# Patient Record
Sex: Male | Born: 1935
Health system: Southern US, Community
[De-identification: ages and names within clinical notes are randomized; demographics above are authoritative.]

## PROBLEM LIST (undated history)

## (undated) DIAGNOSIS — Z9889 Other specified postprocedural states: Secondary | ICD-10-CM

## (undated) DIAGNOSIS — M1990 Unspecified osteoarthritis, unspecified site: Secondary | ICD-10-CM

## (undated) DIAGNOSIS — G8929 Other chronic pain: Secondary | ICD-10-CM

## (undated) DIAGNOSIS — K219 Gastro-esophageal reflux disease without esophagitis: Secondary | ICD-10-CM

## (undated) DIAGNOSIS — E785 Hyperlipidemia, unspecified: Secondary | ICD-10-CM

## (undated) DIAGNOSIS — I491 Atrial premature depolarization: Secondary | ICD-10-CM

## (undated) DIAGNOSIS — N400 Enlarged prostate without lower urinary tract symptoms: Secondary | ICD-10-CM

## (undated) DIAGNOSIS — M549 Dorsalgia, unspecified: Secondary | ICD-10-CM

## (undated) DIAGNOSIS — K635 Polyp of colon: Secondary | ICD-10-CM

## (undated) HISTORY — DX: Polyp of colon: K63.5

## (undated) HISTORY — PX: OTHER SURGICAL HISTORY: SHX169

## (undated) HISTORY — DX: Gastro-esophageal reflux disease without esophagitis: K21.9

## (undated) HISTORY — DX: Atrial premature depolarization: I49.1

## (undated) HISTORY — DX: Dorsalgia, unspecified: M54.9

## (undated) HISTORY — DX: Unspecified osteoarthritis, unspecified site: M19.90

## (undated) HISTORY — DX: Benign prostatic hyperplasia without lower urinary tract symptoms: N40.0

## (undated) HISTORY — DX: Hyperlipidemia, unspecified: E78.5

## (undated) HISTORY — DX: Other specified postprocedural states: Z98.890

## (undated) HISTORY — DX: Other chronic pain: G89.29

---

## 1996-11-01 HISTORY — PX: OTHER SURGICAL HISTORY: SHX169

## 1999-09-29 ENCOUNTER — Encounter: Admission: RE | Admit: 1999-09-29 | Discharge: 1999-09-29 | Payer: Self-pay | Admitting: *Deleted

## 1999-10-01 ENCOUNTER — Ambulatory Visit (HOSPITAL_COMMUNITY): Admission: RE | Admit: 1999-10-01 | Discharge: 1999-10-01 | Payer: Self-pay | Admitting: *Deleted

## 1999-11-04 ENCOUNTER — Inpatient Hospital Stay (HOSPITAL_COMMUNITY): Admission: RE | Admit: 1999-11-04 | Discharge: 1999-11-05 | Payer: Self-pay | Admitting: Neurosurgery

## 1999-11-04 ENCOUNTER — Encounter: Payer: Self-pay | Admitting: Neurosurgery

## 1999-11-19 ENCOUNTER — Encounter: Payer: Self-pay | Admitting: Neurosurgery

## 1999-11-19 ENCOUNTER — Ambulatory Visit (HOSPITAL_COMMUNITY): Admission: RE | Admit: 1999-11-19 | Discharge: 1999-11-19 | Payer: Self-pay | Admitting: Neurosurgery

## 1999-12-10 ENCOUNTER — Encounter: Payer: Self-pay | Admitting: Neurosurgery

## 1999-12-10 ENCOUNTER — Encounter: Admission: RE | Admit: 1999-12-10 | Discharge: 1999-12-10 | Payer: Self-pay | Admitting: Neurosurgery

## 2000-12-13 ENCOUNTER — Ambulatory Visit (HOSPITAL_COMMUNITY): Admission: RE | Admit: 2000-12-13 | Discharge: 2000-12-13 | Payer: Self-pay | Admitting: Internal Medicine

## 2004-11-02 ENCOUNTER — Ambulatory Visit: Payer: Self-pay

## 2005-11-15 ENCOUNTER — Encounter: Admission: RE | Admit: 2005-11-15 | Discharge: 2005-11-15 | Payer: Self-pay | Admitting: Internal Medicine

## 2005-12-01 ENCOUNTER — Other Ambulatory Visit: Admission: RE | Admit: 2005-12-01 | Discharge: 2005-12-01 | Payer: Self-pay | Admitting: Interventional Radiology

## 2005-12-01 ENCOUNTER — Encounter: Admission: RE | Admit: 2005-12-01 | Discharge: 2005-12-01 | Payer: Self-pay | Admitting: Internal Medicine

## 2005-12-01 ENCOUNTER — Encounter (INDEPENDENT_AMBULATORY_CARE_PROVIDER_SITE_OTHER): Payer: Self-pay | Admitting: *Deleted

## 2008-10-11 ENCOUNTER — Emergency Department (HOSPITAL_COMMUNITY): Admission: EM | Admit: 2008-10-11 | Discharge: 2008-10-11 | Payer: Self-pay | Admitting: Family Medicine

## 2009-06-24 ENCOUNTER — Encounter: Admission: RE | Admit: 2009-06-24 | Discharge: 2009-06-24 | Payer: Self-pay | Admitting: Neurosurgery

## 2009-07-30 ENCOUNTER — Inpatient Hospital Stay (HOSPITAL_COMMUNITY): Admission: RE | Admit: 2009-07-30 | Discharge: 2009-08-01 | Payer: Self-pay | Admitting: Neurosurgery

## 2009-07-30 HISTORY — PX: OTHER SURGICAL HISTORY: SHX169

## 2009-08-07 ENCOUNTER — Inpatient Hospital Stay (HOSPITAL_COMMUNITY): Admission: EM | Admit: 2009-08-07 | Discharge: 2009-08-09 | Payer: Self-pay | Admitting: Neurosurgery

## 2009-08-18 ENCOUNTER — Inpatient Hospital Stay (HOSPITAL_COMMUNITY): Admission: AD | Admit: 2009-08-18 | Discharge: 2009-08-21 | Payer: Self-pay | Admitting: Neurosurgery

## 2009-08-19 ENCOUNTER — Ambulatory Visit: Payer: Self-pay | Admitting: Infectious Diseases

## 2009-08-28 ENCOUNTER — Encounter: Payer: Self-pay | Admitting: Infectious Diseases

## 2009-08-31 ENCOUNTER — Encounter: Payer: Self-pay | Admitting: Internal Medicine

## 2009-09-01 ENCOUNTER — Ambulatory Visit: Payer: Self-pay | Admitting: Infectious Diseases

## 2009-09-01 DIAGNOSIS — L299 Pruritus, unspecified: Secondary | ICD-10-CM | POA: Insufficient documentation

## 2009-09-01 DIAGNOSIS — M869 Osteomyelitis, unspecified: Secondary | ICD-10-CM | POA: Insufficient documentation

## 2009-09-01 DIAGNOSIS — G4700 Insomnia, unspecified: Secondary | ICD-10-CM | POA: Insufficient documentation

## 2009-09-02 ENCOUNTER — Encounter: Payer: Self-pay | Admitting: Infectious Diseases

## 2009-09-03 ENCOUNTER — Telehealth: Payer: Self-pay | Admitting: Infectious Diseases

## 2009-09-03 ENCOUNTER — Encounter: Payer: Self-pay | Admitting: Infectious Diseases

## 2009-09-07 ENCOUNTER — Telehealth: Payer: Self-pay | Admitting: Infectious Diseases

## 2009-09-07 ENCOUNTER — Encounter: Payer: Self-pay | Admitting: Infectious Diseases

## 2009-09-09 ENCOUNTER — Telehealth: Payer: Self-pay | Admitting: Internal Medicine

## 2009-09-09 DIAGNOSIS — R079 Chest pain, unspecified: Secondary | ICD-10-CM | POA: Insufficient documentation

## 2009-09-11 ENCOUNTER — Encounter: Payer: Self-pay | Admitting: Infectious Diseases

## 2009-09-12 ENCOUNTER — Encounter: Payer: Self-pay | Admitting: Infectious Diseases

## 2009-09-14 ENCOUNTER — Telehealth: Payer: Self-pay | Admitting: Infectious Diseases

## 2009-09-15 ENCOUNTER — Telehealth (INDEPENDENT_AMBULATORY_CARE_PROVIDER_SITE_OTHER): Payer: Self-pay | Admitting: *Deleted

## 2009-09-15 ENCOUNTER — Ambulatory Visit (HOSPITAL_COMMUNITY): Admission: RE | Admit: 2009-09-15 | Discharge: 2009-09-15 | Payer: Self-pay | Admitting: Infectious Diseases

## 2009-09-16 ENCOUNTER — Encounter (INDEPENDENT_AMBULATORY_CARE_PROVIDER_SITE_OTHER): Payer: Self-pay

## 2009-09-16 ENCOUNTER — Ambulatory Visit: Payer: Self-pay

## 2009-09-16 ENCOUNTER — Encounter (HOSPITAL_COMMUNITY): Admission: RE | Admit: 2009-09-16 | Discharge: 2009-11-04 | Payer: Self-pay | Admitting: Internal Medicine

## 2009-09-16 ENCOUNTER — Telehealth: Payer: Self-pay | Admitting: Infectious Diseases

## 2009-09-17 ENCOUNTER — Encounter: Payer: Self-pay | Admitting: Internal Medicine

## 2009-09-17 ENCOUNTER — Telehealth: Payer: Self-pay | Admitting: Internal Medicine

## 2009-09-18 ENCOUNTER — Encounter: Payer: Self-pay | Admitting: Cardiology

## 2009-09-18 ENCOUNTER — Ambulatory Visit: Payer: Self-pay | Admitting: Cardiology

## 2009-09-18 ENCOUNTER — Ambulatory Visit (HOSPITAL_COMMUNITY): Admission: RE | Admit: 2009-09-18 | Discharge: 2009-09-18 | Payer: Self-pay | Admitting: Cardiology

## 2009-09-18 ENCOUNTER — Encounter: Payer: Self-pay | Admitting: Infectious Diseases

## 2009-09-24 ENCOUNTER — Ambulatory Visit: Payer: Self-pay | Admitting: Infectious Diseases

## 2009-09-24 DIAGNOSIS — R5383 Other fatigue: Secondary | ICD-10-CM

## 2009-09-24 DIAGNOSIS — R5381 Other malaise: Secondary | ICD-10-CM | POA: Insufficient documentation

## 2009-09-24 LAB — CONVERTED CEMR LAB
ALT: 20 units/L (ref 0–53)
BUN: 19 mg/dL (ref 6–23)
Basophils Absolute: 0 10*3/uL (ref 0.0–0.1)
Basophils Relative: 0 % (ref 0–1)
CO2: 28 meq/L (ref 19–32)
CRP: 3.2 mg/dL — ABNORMAL HIGH (ref <0.6)
Creatinine, Ser: 1.13 mg/dL (ref 0.40–1.50)
Eosinophils Relative: 1 % (ref 0–5)
Glucose, Bld: 116 mg/dL — ABNORMAL HIGH (ref 70–99)
Lymphocytes Relative: 9 % — ABNORMAL LOW (ref 12–46)
MCHC: 33.5 g/dL (ref 30.0–36.0)
MCV: 88.1 fL (ref >=78.0)
Neutro Abs: 13.6 10*3/uL — ABNORMAL HIGH (ref 1.7–7.7)
Neutrophils Relative %: 84 % — ABNORMAL HIGH (ref 43–77)
Nitrite: NEGATIVE
Potassium: 4.8 meq/L (ref 3.5–5.3)
RDW: 14.2 % (ref 11.5–15.5)
Sed Rate: 40 mm/hr — ABNORMAL HIGH (ref 0–16)
Specific Gravity, Urine: >=1.03
Total Bilirubin: 0.5 mg/dL (ref 0.3–1.2)
Total Protein: 6.3 g/dL (ref 6.0–8.3)
Urobilinogen, UA: 0.2
WBC Urine, dipstick: NEGATIVE
WBC: 16.1 10*3/uL — ABNORMAL HIGH (ref 4.0–10.5)

## 2009-09-25 ENCOUNTER — Ambulatory Visit (HOSPITAL_COMMUNITY): Admission: RE | Admit: 2009-09-25 | Discharge: 2009-09-25 | Payer: Self-pay | Admitting: Infectious Diseases

## 2009-09-28 ENCOUNTER — Encounter: Payer: Self-pay | Admitting: Infectious Diseases

## 2009-09-28 ENCOUNTER — Encounter: Payer: Self-pay | Admitting: Internal Medicine

## 2009-09-29 ENCOUNTER — Ambulatory Visit: Payer: Self-pay | Admitting: Internal Medicine

## 2009-09-29 ENCOUNTER — Telehealth: Payer: Self-pay | Admitting: Infectious Diseases

## 2009-09-29 DIAGNOSIS — R0989 Other specified symptoms and signs involving the circulatory and respiratory systems: Secondary | ICD-10-CM | POA: Insufficient documentation

## 2009-10-02 ENCOUNTER — Encounter: Payer: Self-pay | Admitting: Infectious Diseases

## 2009-10-05 ENCOUNTER — Encounter: Payer: Self-pay | Admitting: Infectious Diseases

## 2009-10-07 ENCOUNTER — Encounter: Payer: Self-pay | Admitting: Infectious Disease

## 2009-10-09 ENCOUNTER — Ambulatory Visit: Payer: Self-pay | Admitting: Infectious Diseases

## 2009-10-09 DIAGNOSIS — L270 Generalized skin eruption due to drugs and medicaments taken internally: Secondary | ICD-10-CM | POA: Insufficient documentation

## 2009-10-09 LAB — CONVERTED CEMR LAB
Basophils Absolute: 0 10*3/uL (ref 0.0–0.1)
Eosinophils Absolute: 0.4 10*3/uL (ref 0.0–0.7)
Eosinophils Relative: 4 % (ref 0–5)
Lymphocytes Relative: 27 % (ref 12–46)
Neutrophils Relative %: 58 % (ref 43–77)
WBC: 9.6 10*3/uL (ref 4.0–10.5)

## 2009-10-19 ENCOUNTER — Encounter: Payer: Self-pay | Admitting: Infectious Diseases

## 2009-10-27 ENCOUNTER — Ambulatory Visit: Payer: Self-pay | Admitting: Infectious Diseases

## 2009-10-27 LAB — CONVERTED CEMR LAB
Basophils Absolute: 0 10*3/uL (ref 0.0–0.1)
CRP: 0.2 mg/dL (ref <0.6)
Eosinophils Relative: 5 % (ref 0–5)
Lymphocytes Relative: 28 % (ref 12–46)
Lymphs Abs: 2 10*3/uL (ref 0.7–4.0)
Monocytes Absolute: 0.8 10*3/uL (ref 0.1–1.0)
Neutrophils Relative %: 55 % (ref 43–77)
Platelets: 233 10*3/uL (ref 150–400)
RBC: 5.24 M/uL (ref 4.22–5.81)
RDW: 14.7 % (ref 11.5–15.5)
Sed Rate: 5 mm/hr (ref 0–16)

## 2009-10-28 ENCOUNTER — Encounter: Payer: Self-pay | Admitting: Infectious Diseases

## 2009-10-29 ENCOUNTER — Telehealth: Payer: Self-pay | Admitting: Internal Medicine

## 2009-11-01 HISTORY — PX: OTHER SURGICAL HISTORY: SHX169

## 2009-11-03 ENCOUNTER — Encounter: Payer: Self-pay | Admitting: Infectious Diseases

## 2009-11-06 ENCOUNTER — Telehealth: Payer: Self-pay | Admitting: Infectious Diseases

## 2009-11-11 ENCOUNTER — Encounter: Payer: Self-pay | Admitting: Infectious Diseases

## 2009-11-18 ENCOUNTER — Encounter: Payer: Self-pay | Admitting: Infectious Diseases

## 2009-11-18 ENCOUNTER — Telehealth: Payer: Self-pay | Admitting: Infectious Diseases

## 2009-12-01 ENCOUNTER — Ambulatory Visit: Payer: Self-pay | Admitting: Infectious Diseases

## 2010-01-27 ENCOUNTER — Telehealth: Payer: Self-pay | Admitting: Internal Medicine

## 2010-01-27 ENCOUNTER — Encounter: Payer: Self-pay | Admitting: Internal Medicine

## 2010-01-28 ENCOUNTER — Ambulatory Visit: Payer: Self-pay | Admitting: Internal Medicine

## 2010-01-28 DIAGNOSIS — I4949 Other premature depolarization: Secondary | ICD-10-CM | POA: Insufficient documentation

## 2010-02-01 ENCOUNTER — Encounter (INDEPENDENT_AMBULATORY_CARE_PROVIDER_SITE_OTHER): Payer: Self-pay | Admitting: *Deleted

## 2010-02-02 LAB — CONVERTED CEMR LAB
CO2: 26 meq/L (ref 19–32)
Chloride: 103 meq/L (ref 96–112)
Creatinine, Ser: 1.2 mg/dL (ref 0.4–1.5)
Glucose, Bld: 84 mg/dL (ref 70–99)
Magnesium: 2.2 mg/dL (ref 1.5–2.5)

## 2010-02-19 ENCOUNTER — Telehealth: Payer: Self-pay | Admitting: Gastroenterology

## 2010-02-24 ENCOUNTER — Encounter (INDEPENDENT_AMBULATORY_CARE_PROVIDER_SITE_OTHER): Payer: Self-pay | Admitting: *Deleted

## 2010-03-02 ENCOUNTER — Telehealth: Payer: Self-pay | Admitting: Internal Medicine

## 2010-03-04 ENCOUNTER — Ambulatory Visit: Payer: Self-pay | Admitting: Cardiology

## 2010-03-04 ENCOUNTER — Ambulatory Visit: Payer: Self-pay | Admitting: Internal Medicine

## 2010-03-04 ENCOUNTER — Telehealth: Payer: Self-pay | Admitting: Internal Medicine

## 2010-03-04 DIAGNOSIS — I498 Other specified cardiac arrhythmias: Secondary | ICD-10-CM | POA: Insufficient documentation

## 2010-03-09 ENCOUNTER — Telehealth: Payer: Self-pay | Admitting: Gastroenterology

## 2010-03-10 ENCOUNTER — Ambulatory Visit: Payer: Self-pay | Admitting: Internal Medicine

## 2010-03-10 DIAGNOSIS — R0602 Shortness of breath: Secondary | ICD-10-CM | POA: Insufficient documentation

## 2010-03-10 LAB — CONVERTED CEMR LAB: Free T4: 0.91 ng/dL (ref 0.60–1.60)

## 2010-03-11 LAB — CONVERTED CEMR LAB
ALT: 21 units/L (ref 0–53)
CO2: 29 meq/L (ref 19–32)
Chloride: 101 meq/L (ref 96–112)
Total Protein: 6.7 g/dL (ref 6.0–8.3)

## 2010-04-01 ENCOUNTER — Telehealth (INDEPENDENT_AMBULATORY_CARE_PROVIDER_SITE_OTHER): Payer: Self-pay | Admitting: *Deleted

## 2010-04-02 ENCOUNTER — Encounter: Payer: Self-pay | Admitting: Internal Medicine

## 2010-04-02 ENCOUNTER — Ambulatory Visit: Payer: Self-pay | Admitting: Internal Medicine

## 2010-04-02 ENCOUNTER — Ambulatory Visit: Payer: Self-pay

## 2010-04-02 ENCOUNTER — Ambulatory Visit (HOSPITAL_COMMUNITY): Admission: RE | Admit: 2010-04-02 | Discharge: 2010-04-02 | Payer: Self-pay | Admitting: Internal Medicine

## 2010-04-05 ENCOUNTER — Encounter (INDEPENDENT_AMBULATORY_CARE_PROVIDER_SITE_OTHER): Payer: Self-pay | Admitting: *Deleted

## 2010-04-07 ENCOUNTER — Ambulatory Visit: Payer: Self-pay | Admitting: Gastroenterology

## 2010-04-13 ENCOUNTER — Telehealth (INDEPENDENT_AMBULATORY_CARE_PROVIDER_SITE_OTHER): Payer: Self-pay | Admitting: *Deleted

## 2010-04-19 ENCOUNTER — Ambulatory Visit: Payer: Self-pay | Admitting: Gastroenterology

## 2010-04-19 DIAGNOSIS — K635 Polyp of colon: Secondary | ICD-10-CM

## 2010-04-19 DIAGNOSIS — K219 Gastro-esophageal reflux disease without esophagitis: Secondary | ICD-10-CM | POA: Insufficient documentation

## 2010-04-19 HISTORY — DX: Polyp of colon: K63.5

## 2010-04-19 LAB — CONVERTED CEMR LAB: UREASE: NEGATIVE

## 2010-04-20 ENCOUNTER — Ambulatory Visit: Payer: Self-pay | Admitting: Internal Medicine

## 2010-04-22 ENCOUNTER — Encounter: Payer: Self-pay | Admitting: Gastroenterology

## 2010-05-05 ENCOUNTER — Telehealth: Payer: Self-pay | Admitting: Internal Medicine

## 2010-05-13 ENCOUNTER — Ambulatory Visit: Payer: Self-pay | Admitting: Internal Medicine

## 2010-05-13 DIAGNOSIS — I491 Atrial premature depolarization: Secondary | ICD-10-CM | POA: Insufficient documentation

## 2010-07-25 ENCOUNTER — Encounter: Payer: Self-pay | Admitting: Internal Medicine

## 2010-08-03 NOTE — Progress Notes (Signed)
Summary: Nuclear Pre-Procedure  Phone Note Outgoing Call   Call placed by: Milana Na, EMT-P,  September 15, 2009 12:33 PM Summary of Call: Reviewed information on Myoview Information Sheet (see scanned document for further details).  Spoke with patient.     Nuclear Med Background Indications for Stress Test: Evaluation for Ischemia   History: Myocardial Perfusion Study  History Comments: '06 MPS NL EF 56%  Symptoms: Chest Pain, Chest Pressure    Nuclear Pre-Procedure Cardiac Risk Factors: Lipids, RBBB Height (in): 72  Nuclear Med Study Referring MD:  D.Bensimhon

## 2010-08-03 NOTE — Letter (Signed)
Summary: Patient Notice- Polyp Results  [facility]  [address] [place], [place] [address]   Phone: [phone]  Fax: [phone]        April 22, 2010 MRN: [id number]    Timothy Aguirre [address] [place], [place]  [address]    Dear Mr. Rueter,  I am pleased to inform you that the colon polyp(s) removed during your recent colonoscopy was (were) found to be benign (no cancer detected) upon pathologic examination.  I recommend you have a repeat colonoscopy examination in 10_ years to look for recurrent polyps, as having colon polyps increases your risk for having recurrent polyps or even colon cancer in the future.  Should you develop new or worsening symptoms of abdominal pain, bowel habit changes or bleeding from the rectum or bowels, please schedule an evaluation with either your primary care physician or with me.  Additional information/recommendations:  xx__ No further action with gastroenterology is needed at this time. Please      follow-up with your primary care physician for your other healthcare      needs.  __ Please call [phone] to schedule a return visit to review your      situation.  __ Please keep your follow-up visit as already scheduled.  __ Continue treatment plan as outlined the day of your exam.  Please call us if you are having persistent problems or have questions about your condition that have not been fully answered at this time.  Sincerely,  Mardella Layman MD [facility]  This letter has been electronically signed by your physician.  Appended Document: Patient Notice- Polyp Results letter mailed

## 2010-08-03 NOTE — Procedures (Signed)
Summary: Summary Report  Summary Report   Imported By: Erle Crocker 05/26/2010 15:59:11  _____________________________________________________________________  External Attachment:    Type:   Image     Comment:   External Document

## 2010-08-03 NOTE — Progress Notes (Signed)
Summary: Clarification on Medication  Phone Note From Pharmacy   Caller: [facility] Details for Reason: prescription clarification Summary of Call: Received a fax from pharmacy clarifiying the form for Doxycycline Hyclate 100mg . Patienth has been getting the tablets for awhile.  The capsules are listed in EMR. spoke to [facility] who said it was ok for patient to have what he has been taking in which is the Docycycline Hyclate 100mg  tablets.Medication fixed to the tablet form and sent to pharmacy. Initial call taken by: Paulo Fruit  BS,CPht II,MPH,  Nov 06, 2009 4:53 PM    New/Updated Medications: DOXYCYCLINE HYCLATE 100 MG CAPS (DOXYCYCLINE HYCLATE) Take 1 tablet by mouth two times a day the number it was faxed to is [facility] [phone] Paulo Fruit  BS,CPht II,MPH  Nov 06, 2009 4:54 PM

## 2010-08-03 NOTE — Progress Notes (Signed)
Summary: Wife requesting call from Dr. Sampson Goon re: lab results  Phone Note Call from Patient Call back at Home Phone [phone]   Caller: Spouse Call For: Timothy Braun MD Reason for Call: Talk to Doctor Summary of Call: Wife left message requesting a call from MD re: "blood culture results."  Pt. had PIC replaced. Timothy Maduro RN  September 16, 2009 1:59 PM   Follow-up for Phone Call        i spoke with wife and he is feeling weak and tired.  No further fevers chills ns, n/v/abd pain or diarrhea.  Wound is improving - no drainage still with wound vac.  I asked her to make him an apptment with Dr Timothy Lasso for tomorrow to have someone lay eyes on him and check some vitals.  Follow-up by: Timothy Braun MD,  September 17, 2009 2:05 PM

## 2010-08-03 NOTE — Assessment & Plan Note (Signed)
Summary: f/u appt/per dr Terah Robey   Referring Provider:  Dr Clelia Croft Primary Provider:  Dr Timothy Lasso   History of Present Illness: Timothy Aguirre is here due to rash along the wound that started yesterday.  He has had removal of wound vac and the wound was doing well until yesterday after a shower his wife noticed increased redness in a square around the incision.  He had no fevers chills ns or other sxs.  His wound has continued to heal and is almost closed.  I advised yest to stop using tape on the wound and to use instead the tegaderm used with the wound vac.   The redness has actually subsided somewhat since then. remains on doxy with no problems.  Has increase energy and has actually gone back to work for afew hours over the last few days.     Current Allergies: ! CEFAZOLIN SODIUM (CEFAZOLIN SODIUM) ! VANCOMYCIN ! * TAPE Past History:  Past Medical History: Last updated: 09/01/2009 1. Several prior surgeries, one on C-spine and one prior on his back.   2. GERD.   3. Hyperlipidemia.   4. BPH.   5. Chronic back pain.   Past Surgical History: Last updated: 09/29/2009 1. Microdiskectomy in May 1998.   2. ACDF C4-5, C5-C6, C6-7, May 2001.   3. Redo decompression, July 30, 2009.  Family History: Last updated: 09/29/2009 noncontributory  Social History: Last updated: 09/29/2009 lives with wife.  Works as a Merchandiser, retail. Non-smoker.  Risk Factors: Alcohol Use: occassionally during the week and on weekends (09/24/2009) Caffeine Use: coffee and sodas (09/24/2009) Exercise: no (09/24/2009)  Risk Factors: Smoking Status: never (09/24/2009)  Review of Systems       11 systems reviewed and negative except per HPI   Physical Exam  General:  alert and well-developed.   Eyes:  vision grossly intact, pupils equal, and pupils reactive to light.   Mouth:  good dentition.   Neck:  supple.   Lungs:  normal respiratory effort and normal breath sounds.   Heart:  normal rate and  regular rhythm.   Abdomen:  soft and non-tender.   Msk:  normal ROM and no joint tenderness.   Skin:  back incision is almost completely healed.  Faint redness aroudn the site but minimal.  no drainge.non tender Psych:  Oriented X3 and memory intact for recent and remote.     Impression & Recommendations:  Problem # 1:  OTH INFS INVLV BONE DZ CLASS ELSW OTH SPEC SITES (ICD-730.88) MSSA and Kleb Oxytoca on cxs but kleb was only on superficial cx and was "rare".  MSSA is likely real pathogen.  He is probably allergic to acef and had a ? rxn to vanco.   Had been on daptomycin and cipro for about 4 wks now.  Was actually feeling rather ill since an episode of fever that lead to removal of his picc line and replacement with another one.  His sxs were very non specific however.  He did however have an increased WBC.  Repeat MRi of spine was neg as were BCX, Tee and cardiac eval.   We have switched to oral abx and he is doing well.   Given that hardware was exposed at site of infection he will continue a tail of by mouth doxy two times a day for another month then consider change to once daily suppression   BW from 2/25 with wbc 12.8 aec 5%, cr 1.19 and ck 87.  BW 3/25-  His updated  medication list for this problem includes:    Doxycycline Hyclate 100 Mg Caps (Doxycycline hyclate) ..... One by mouth two times a day    Tramadol Hcl 50 Mg Tabs (Tramadol hcl) .Marland Kitchen... Three times a day as needed    Aspirin 81 Mg Tbec (Aspirin) .Marland Kitchen... Take one tablet by mouth daily  Orders: T-CBC w/Diff ([address]) Est. Patient Level III ([address])  Problem # 2:  CUTANEOUS ERUPTIONS, DRUG-INDUCED (ICD-693.0) I suspect he is allergic to tape whcih may have triggered this whole event after his first surgey.  Will list as an allergy.  Can use tegaderm instead for now.    Medications Added to Medication List This Visit: 1)  Ambien 5 Mg Tabs (Zolpidem tartrate) .... One by mouth at bedtime as needed insomnia  Patient  Instructions: 1)  Cancel apptment next week. 2)  Reschedule the week of the 26th of April.   3)  Continue dressing changes as you are doing. 4)  Continue doxycycline 100 by mouth two times a day until you are seen again. Prescriptions: AMBIEN 5 MG TABS (ZOLPIDEM TARTRATE) one by mouth at bedtime as needed insomnia  #30 x 0   Entered and Authorized by:   Clydie Braun MD   Signed by:   Clydie Braun MD on 10/09/2009   Method used:   Print then Give to Patient   RxID:   [id number]

## 2010-08-03 NOTE — Assessment & Plan Note (Signed)
Summary: EKG/hms  Nurse Visit   Allergies: 1)  ! Cefazolin Sodium (Cefazolin Sodium) 2)  ! Vancomycin 3)  ! * Tape 03/04/10--0945--pt here for EKG in nurse room--EKG reviewed with dr klein--pt voicing no complaints or symptoms with the exception of slight panting, which is not on regular basis--nt

## 2010-08-03 NOTE — Miscellaneous (Signed)
Summary: Cancellation of myoview  Clinical Lists Changes     The patient here to discuss myoview scheduled for today. The patient has a staph infection and has been on IV antibiotics.He now as a secondary infection at the Pic-line site with Temperatures to 101-102 4 days ago, and  the line had to be changed so he has been off his antiobiotics 2 days.He has not got the culture result back yet, but is suppose to be on antiobiotics 3 more weeks.The patient is not having any chest pain, and will call if he does. He will call us back to reschedule the [facility] when he is feeling better. Dr. Prescott Gum nurse notified of cancellation.Nyree Applegate.RN.

## 2010-08-03 NOTE — Letter (Signed)
Summary: Endo/Colon Letter  [facility]  [address] [place], [place] [address]   Phone: [phone]  Fax: [phone]      February 01, 2010 MRN: [id number]   Timothy Aguirre [address] [place], [place]  [address]   Dear Timothy Aguirre,   According to your medical record, it is time for you to schedule an Endoscopy/Colonoscopy . Endoscopic screeening is recommended for patients with certain upper digestive tract conditions because of associated increased risk for cancers of the upper digestive system. The American Cancer Society recommends Colonoscopy as a method to detect early colon cancer. Patients with a family history of colon cancer, or a personal history of colon polyps or inflammatory bowel disease are at increased risk.  This letter has been generated based on the recommendations made at the time of your prior procedure. If you feel that in your particular situation this may no longer apply, please contact our office.  Please call our office at [phone] to schedule this appointment or to update your records at your earliest convenience.  Thank you for cooperating with [place] to provide you with the very best care possible.   Sincerely,   Vania Rea. Jarold Motto, M.D.  [facility] [phone]

## 2010-08-03 NOTE — Progress Notes (Signed)
Summary: ECL  Phone Note Call from Patient Call back at Work Phone [phone]   Caller: Patient Call For: Dr. Jarold Motto Reason for Call: Talk to Nurse Summary of Call: would like to know why he was changed to an ECL verses COL before scheduling... paper chart already requested attn to [facility] Initial call taken by: Vallarie Mare,  February 19, 2010 3:24 PM  Follow-up for Phone Call        Received chart.  Chart to Dr. Jarold Motto.. Follow-up by: Ashok Cordia RN,  February 24, 2010 9:10 AM  Additional Follow-up for Phone Call Additional follow up Details #1::        Dr. Jarold Motto reviewed chart.  Pt only needs Colonoscopy.   LM for pt to call. Additional Follow-up by: Ashok Cordia RN,  February 24, 2010 4:15 PM    Additional Follow-up for Phone Call Additional follow up Details #2::    Pt called, colon and previsit scheduled. Follow-up by: Ashok Cordia RN,  February 24, 2010 4:53 PM

## 2010-08-03 NOTE — Miscellaneous (Signed)
Summary: HIPAA Restrictions  HIPAA Restrictions   Imported By: Florinda Marker 09/03/2009 08:50:08  _____________________________________________________________________  External Attachment:    Type:   Image     Comment:   External Document

## 2010-08-03 NOTE — Miscellaneous (Signed)
Summary: [facility]:  Orders  [facility]:  Orders   Imported By: Florinda Marker 12/31/2009 15:37:19  _____________________________________________________________________  External Attachment:    Type:   Image     Comment:   External Document

## 2010-08-03 NOTE — Miscellaneous (Signed)
Summary: [facility] C are:  [facility] C are:   Imported By: Florinda Marker 10/16/2009 14:22:44  _____________________________________________________________________  External Attachment:    Type:   Image     Comment:   External Document

## 2010-08-03 NOTE — Progress Notes (Signed)
Summary: NEEDS picc replaced  Phone Note Other Incoming   Summary of Call: Spoke with Dr Maurice March.  Over weekend pt had fever to 100.2 that then incrased to 101.  Dr lane had BW done and had picc removed. He needs replacemento of picc line today or tomoroow.  Can you call IR to arrange. Gabriela Eves Initial call taken by: Clydie Braun MD,  September 14, 2009 10:17 AM  Follow-up for Phone Call        PICC placement scheduled. Pt and wife informed.  Additional Follow-up for Phone Call Additional follow up Details #1::        will change to doxy 100 by mouth two times a day until picc replaced  Additional Follow-up by: Clydie Braun MD,  September 14, 2009 2:32 PM    New/Updated Medications: DOXYCYCLINE HYCLATE 100 MG CAPS (DOXYCYCLINE HYCLATE) one by mouth two times a day for 7 days Prescriptions: DOXYCYCLINE HYCLATE 100 MG CAPS (DOXYCYCLINE HYCLATE) one by mouth two times a day for 7 days  #14 x 0   Entered by:   Tomasita Morrow RN   Authorized by:   Clydie Braun MD   Signed by:   Tomasita Morrow RN on 09/14/2009   Method used:   Electronically to        [facility]* (retail)       [address]       [place], [place]  [address]       Ph: [phone] or [phone]       Fax: [phone]   RxID:   [id number] DOXYCYCLINE HYCLATE 100 MG CAPS (DOXYCYCLINE HYCLATE) one by mouth two times a day for 7 days  #14 x 0   Entered and Authorized by:   Clydie Braun MD   Signed by:   Clydie Braun MD on 09/14/2009   Method used:   Telephoned to ...         RxID:   [id number]

## 2010-08-03 NOTE — Progress Notes (Signed)
Summary: sch myoview  Phone Note Other Incoming   Summary of Call: Received mess from Dr Gala Romney that pt needed to have lexiscan myoview, pt needs myoview instead of gxt b/c he is unable to walk on treadmill due to wound vac, pt needs study for chest pain will order Meredith Staggers, RN  September 09, 2009 3:45 PM   New Problems: CHEST PAIN UNSPECIFIED (ICD-786.50)   New Problems: CHEST PAIN UNSPECIFIED (ICD-786.50)

## 2010-08-03 NOTE — Progress Notes (Signed)
Summary: Home Health visit  Phone Note From Other Clinic   Caller: [facility] Nurse [phone] Summary of Call:  voice mail from 09-04-09  Home Health Agency called : during visit pulse was low  138/78 P: 52 R: 20.   Nurse concerned about pulse rate. Please advise. Tomasita Morrow RN  September 07, 2009 10:18 AM   Follow-up for Phone Call        discussed with rn. Will repeat today at visit.  pt asymptomatic Follow-up by: Clydie Braun MD,  September 07, 2009 11:36 AM

## 2010-08-03 NOTE — Progress Notes (Signed)
Summary: monitor results  Phone Note Outgoing Call   Call placed by: Meredith Staggers, RN,  May 05, 2010 5:32 PM Call placed to: Patient Summary of Call: Dr Gala Romney reviewed monitor results--SR w/extensive atrial ectopy w/PACs, atrial bigeminy, brief runs SVT, also frequent PVCs, make sure pt has f/u appt.  Pt is sch to see Dr Gala Romney on 12/7 have called pt and Left message to call back   Follow-up for Phone Call        pt returned call from yesterday-pls call [phone] Glynda Jaeger  May 06, 2010 9:09 AM  pt aware he will keep appt 12/7, he would like to discuss w/Dr Bensimhon over phone briefly if possible, will send mess to him Meredith Staggers, RN  May 06, 2010 2:27 PM   Additional Follow-up for Phone Call Additional follow up Details #1::        can we make him an appt with EP this month?Dolores Patty, MD, [facility]  May 07, 2010 11:54 AM  Dr Gala Romney left mess on pts vm about monitor and referral to EP [facility], RN  May 11, 2010 2:14 PM

## 2010-08-03 NOTE — Progress Notes (Signed)
Summary: Wants to have ECL instead of just Colon  Phone Note Call from Patient Call back at Work Phone [phone] Call back at until 5pm   Call For: DR PATTERSON Summary of Call: Scheduled for Colon and wants to have ECL same day? Initial call taken by: Leanor Kail [facility],  March 09, 2010 4:22 PM  Follow-up for Phone Call        Left a message on patients machine to call back. Harlow Mares CMA [name])  March 09, 2010 4:30 PM  Patient has thought over the weekend and has not decided to he would like to have a ECL now not just a colonoscopy. Is it ok to do endo he states that he is not having any problems with his reflux.  Follow-up by: Harlow Mares CMA [name]),  March 09, 2010 4:38 PM  Additional Follow-up for Phone Call Additional follow up Details #1::        OK Additional Follow-up by: Mardella Layman MD [facility],  March 10, 2010 9:57 AM    Additional Follow-up for Phone Call Additional follow up Details #2::    No Answer Harlow Mares CMA [facility])  March 10, 2010 10:34 AM   resch with pt for 04/19/2010 Follow-up by: Harlow Mares CMA (AAMA),  March 10, 2010 3:15 PM

## 2010-08-03 NOTE — Procedures (Signed)
Summary: Colonoscopy  Patient: Timothy Aguirre Note: All result statuses are Final unless otherwise noted.  Tests: (1) Colonoscopy (COL)   COL Colonoscopy           DONE     [facility]     [address]     [place], [place]  [address]           COLONOSCOPY PROCEDURE REPORT           PATIENT:  Timothy Aguirre, Timothy Aguirre  MR#:  [id number]     BIRTHDATE:  [birth year], 75 yrs. old  GENDER:  male     ENDOSCOPIST:  Vania Rea. Jarold Motto, MD, [facility]     REF. BY:     PROCEDURE DATE:  04/19/2010     PROCEDURE:  Colonoscopy with snare polypectomy     ASA CLASS:  Class II     INDICATIONS:  Routine Risk Screening     MEDICATIONS:   Fentanyl 75 mcg IV, Versed 8 mg IV           DESCRIPTION OF PROCEDURE:   After the risks benefits and     alternatives of the procedure were thoroughly explained, informed     consent was obtained.  Digital rectal exam was performed and     revealed no abnormalities.   The LB [id number] endoscope     was introduced through the anus and advanced to the cecum, which     was identified by both the appendix and ileocecal valve, without     limitations.  The quality of the prep was excellent, using     MoviPrep.  The instrument was then slowly withdrawn as the colon     was fully examined.     <<PROCEDUREIMAGES>>           FINDINGS:  Multiple polyps were found in the cecum. FLAT     POLYPS SIDE BY SIDE COLD SNARE REMOVED AND SENT TO PATH.     Scattered diverticula were found in the ascending colon.  Moderate     diverticulosis was found in the sigmoid to descending colon     segments.   Retroflexed views in the rectum revealed no     abnormalities.    The scope was then withdrawn from the patient     and the procedure completed.           COMPLICATIONS:  None     ENDOSCOPIC IMPRESSION:     1) Polyp, multiple in the cecum     2) Diverticula, scattered in the ascending colon     3) Moderate diverticulosis in the sigmoid to descending colon     segments  R/O ADENOMAS.     RECOMMENDATIONS:     1) Await biopsy results     2) Repeat colonoscopy in 5 years if polyp adenomatous; otherwise     10 years     3) High fiber diet.     REPEAT EXAM:  No           ______________________________     Vania Rea. Jarold Motto, MD, [facility]           CC:  Creola Corn, MD           n.     Rosalie Doctor:   Vania Rea. Patterson at 04/19/2010 04:03 PM           Dorise Hiss, [id number]  Note: An exclamation mark (!) indicates a result that was not dispersed into the  flowsheet. Document Creation Date: 04/19/2010 4:04 PM _______________________________________________________________________  (1) Order result status: Final Collection or observation date-time: 04/19/2010 15:51 Requested date-time:  Receipt date-time:  Reported date-time:  Referring Physician:   Ordering Physician: Sheryn Bison [id number]) Specimen Source:  Source: Launa Grill Order Number: [id number] Lab site:   Appended Document: Colonoscopy     Procedures Next Due Date:    Colonoscopy: 04/2020

## 2010-08-03 NOTE — Progress Notes (Signed)
Summary: talk to nurse  Phone Note Call from Patient Call back at Home Phone [phone]   Caller: Patient Summary of Call: pt neurosurgeon felt like pt needed to be seen. pt having some bradycardia with SOB and low energy. call pt to discuss this further. Initial call taken by: Edman Circle,  March 02, 2010 3:44 PM  Follow-up for Phone Call        has been having SOB, HR has been running low around 40s today, when he was at [facility] last week his HR in 30 had EKG which showed bigeminy but pt felt fine,  Today he saw Dr Elenore Paddy and his HR was 40, pts wife is concerned b/c she noticed he gets much more SOB and increased fatigue, will discuss w/Dr Bensimhon tom and call her back Meredith Staggers, RN  March 02, 2010 3:53 PM   Additional Follow-up for Phone Call Additional follow up Details #1::        bring in for eg for full thyroid panel. will need office visit. Dolores Patty, MD, [facility]  March 02, 2010 10:27 PM   spoke w/pts wife, he will come for EKG and labs tom at 9am, he is sch to see Dr Gala Romney on 9/7 at 10:45 Meredith Staggers, RN  March 03, 2010 5:03 PM

## 2010-08-03 NOTE — Letter (Signed)
Summary: Generic on Letterhead Paper  [facility]  [address]   [place], [place] [address]   Phone: [phone]  Fax: [phone]     Today's Date:  September 24, 2009  Re:  CHRIS CRIPPS  To whom it may concern,  Mr Mcniel was scheduled to take the CE [facility] exam on March 14th but due to ongoing severe illness he was advised not to sit for the exam.  Please call with questions.   Sincerely,           Sincerely,    Clydie Braun MD

## 2010-08-03 NOTE — Medication Information (Signed)
Summary: [facility]: RX Clarification  [facility]: RX Clarification   Imported By: Florinda Marker 11/13/2009 14:53:18  _____________________________________________________________________  External Attachment:    Type:   Image     Comment:   External Document

## 2010-08-03 NOTE — Procedures (Signed)
Summary: Upper Endoscopy  Patient: Timothy Aguirre Note: All result statuses are Final unless otherwise noted.  Tests: (1) Upper Endoscopy (EGD)   EGD Upper Endoscopy       DONE     [facility]     [address]     [place], [place]  [address]           ENDOSCOPY PROCEDURE REPORT           PATIENT:  Timothy Aguirre, Timothy Aguirre  MR#:  [id number]     BIRTHDATE:  03-28-1934, 75 yrs. old  GENDER:  male           ENDOSCOPIST:  Vania Rea. Jarold Motto, MD, [facility]     Referred by:           PROCEDURE DATE:  04/19/2010     PROCEDURE:  EGD with biopsy     ASA CLASS:  Class II     INDICATIONS:  GERD CHRONIC GERD GREATER THAN 20 YEARS.           MEDICATIONS:   There was residual sedation effect present from     prior procedure., Fentanyl 25 mcg IV, Versed 2 mg IV     TOPICAL ANESTHETIC:           DESCRIPTION OF PROCEDURE:   After the risks benefits and     alternatives of the procedure were thoroughly explained, informed     consent was obtained.  The LB GIF-H180 [id number] endoscope was     introduced through the mouth and advanced to the second portion of     the duodenum, without limitations.  The instrument was slowly     withdrawn as the mucosa was fully examined.     <<PROCEDUREIMAGES>>           A hiatal hernia was found. 3 CM HH NOTED.SEE PICTURES.  Normal GE     junction was noted. SOME CHRONIC STENOSIS.  Moderate gastritis was     found in the body and the antrum of the stomach. CLO BX. DONE.NO     ULCERATIONS.  Normal duodenal folds were noted.  The esophagus and     gastroesophageal junction were completely normal in appearance.     Retroflexed views revealed a hiatal hernia.    The scope was then     withdrawn from the patient and the procedure completed.           COMPLICATIONS:  None           ENDOSCOPIC IMPRESSION:     1) Hiatal hernia     2) Normal GE junction     3) Moderate gastritis in the body and the antrum of the stomach           4) Normal duodenal folds     5) Normal  esophagus     6) A hiatal hernia     HH AND CHRONIC GERD.NO BARRETT'S MUCOSA,STRICTURE NOT DILATED     SINCE ASYMPTOMATIC.     RECOMMENDATIONS:     1) continue PPI     2) continue current medications           REPEAT EXAM:  No           ______________________________     Vania Rea. Jarold Motto, MD, [facility]           CC:  Creola Corn, MD           n.     eSIGNED:  Vania Rea. Morganna Styles at 04/19/2010 04:11 PM           Dorise Hiss, [phone]  Note: An exclamation mark (!) indicates a result that was not dispersed into the flowsheet. Document Creation Date: 04/19/2010 4:11 PM _______________________________________________________________________  (1) Order result status: Final Collection or observation date-time: 04/19/2010 15:59 Requested date-time:  Receipt date-time:  Reported date-time:  Referring Physician:   Ordering Physician: Sheryn Bison [phone]) Specimen Source:  Source: Launa Grill Order Number: [id number] Lab site:

## 2010-08-03 NOTE — Progress Notes (Signed)
Summary: cx'd myoview  ---- Converted from flag ---- ---- 09/16/2009 9:52 AM, Irean Hong, RN wrote: Timothy Aguirre, The patient here to discuss myoview scheduled for today. The patient has a staph infection and has been on IV antibiotics.He now as a secondary infection at the Pic-line site with Temperatures to 101-102 4 days ago, and  the line had to be changed so he has been off his antiobiotics 2 days.He has not got the culture result back yet, but is suppose to be on antiobiotics 3 more weeks.The patient is not having any chest pain, and will call if he does. He will call us back to reschedule the [facility] when he is feeling better. Thanks,  Patsy ------------------------------

## 2010-08-03 NOTE — Letter (Signed)
Summary: [facility]  [facility]   Imported By: Roderic Ovens 10/07/2009 16:09:39  _____________________________________________________________________  External Attachment:    Type:   Image     Comment:   External Document

## 2010-08-03 NOTE — Progress Notes (Signed)
Summary: questions about steroid injections  Phone Note Call from Patient   Caller: Patient Call For: Clydie Braun MD Summary of Call: Patient wanted to know if he should get steroid injections next week. Initial call taken by: Starleen Arms CMA,  Nov 18, 2009 4:52 PM  Follow-up for Phone Call        spoke with him - ok to get steroid shot in shoulder if needed Follow-up by: Clydie Braun MD,  Nov 24, 2009 8:48 AM

## 2010-08-03 NOTE — Consult Note (Signed)
Summary: [facility]  [facility]   Imported By: Florinda Marker 12/11/2009 14:12:50  _____________________________________________________________________  External Attachment:    Type:   Image     Comment:   External Document

## 2010-08-03 NOTE — Progress Notes (Signed)
Summary: elevated CPK on daptomycin  Phone Note Outgoing Call   Summary of Call: labs reviewed.  His Cpk jukmed from 87 to 1050.  Per pharmacist he has no sxs of myopathy. Given no sxs and ck about 5 x uln will continue and check again tomorrow and then monday.  If sxs recur will change to tigecycline or retry vancomycin. Initial call taken by: Clydie Braun MD,  September 03, 2009 4:43 PM

## 2010-08-03 NOTE — Assessment & Plan Note (Signed)
Summary: rov   Visit Type:  Follow-up Referring Provider:  Dr Clelia Croft Primary Provider:  Dr Timothy Lasso  CC:  low heart rate.  History of Present Illness: Timothy Aguirre is a 77 y/o male with h/o degenerative disk disease s/p multiple back and neck surgeries, GERD and hyperlipidemia. We saw him in march for sinus tach due to infection s/p lumbar surgery. routines for f/u.  Denies any h/o known cardiac disease. Has never had a cath. Had stress test in 2006 but doesn't remember results. Thinks it was normal.  In January, underwent redo lumbar decompression with fusion which was complicated by staph hardware infection and had to be debrided in February and was place on IV antiobiotics. He was having some CP posteropreratively which he thinks was gas and was supposed to see me for a stress test. However, developed recurrent fevers and question of a new murmur so underwent TEE EF 60-65%.  No obvious vegetation or abscess but MIV a lttile thick. Mild MR. Trivial TR.  Feeling a lot better. Last month went to see Dr. Kerby Nora at [facility] and HR in 30s. Saw Dr. Venetia Maxon last week an HR again was in 30s by pulse (no ECG). Came here for ECG and thryoid panel. ECG with atrial bigeminy in 80s. Thyroid panel normal reviewed by Dr. Graciela Husbands.   No syncope. No palpitations. Going to gym on regular basis and walking on treadmin without CP. Occasionally SOB. Snoring improved but has daytime somnolence.  Current Medications (verified): 1)  Nexium 40 Mg Cpdr (Esomeprazole Magnesium) .... Once Daily 2)  Multivitamins   Tabs (Multiple Vitamin) .... Once Daily 3)  Fish Oil   Oil (Fish Oil) .... Once Daily 4)  Aspirin 81 Mg Tbec (Aspirin) .... Take One Tablet By Mouth Daily 5)  Avodart 0.5 Mg Caps (Dutasteride) .... Take One Capsule One Time A Day 6)  Enablex 7.5 Mg Xr24h-Tab (Darifenacin Hydrobromide) .... Take One Tablet One Time A Day 7)  Lipitor 20 Mg Tabs (Atorvastatin Calcium) .... Take One Tablet One Time A Day  Allergies: 1)  !  Cefazolin Sodium (Cefazolin Sodium) 2)  ! Vancomycin 3)  ! * Tape  Review of Systems       As per HPI and past medical history; otherwise all systems negative.   Vital Signs:  Patient profile:   77 year old male Height:      72 inches Weight:      223 pounds Pulse rate:   70 / minute BP sitting:   124 / 70  Vitals Entered By: Jacquelin Hawking, CMA (March 10, 2010 11:18 AM)  Physical Exam  General:  Well appearing. no resp difficulty HEENT: normal Neck: supple. no JVD. Carotids 2+ bilat; no bruits. No lymphadenopathy or thryomegaly appreciated. Cor: PMI nondisplaced. Regularly irregularrhythm. No rubs, gallops, murmur. Lungs: clear Abdomen: soft, nontender, nondistended. No hepatosplenomegaly. No bruits or masses. Good bowel sounds. Extremities: no cyanosis, clubbing, rash, edema Neuro: alert & orientedx3, cranial nerves grossly intact. moves all 4 extremities w/o difficulty. affect pleasant    Impression & Recommendations:  Problem # 1:  OTHER SPECIFIED CARDIAC DYSRHYTHMIAS (ICD-427.89) He is having atrial bigeminy which I suspect may be suppressed during exercise but can signifcantly reduce his cardiac output at rest. Will place 48-holter monitor and check electrolytes. (thyroid recently normal) May need repeat sleep study as can be exacerabted by OSA.  Problem # 2:  DYSPNEA (ICD-786.05) Likely multifactorial. Given cardiac risk factors needs stress test. Will check stress echo.   Other  Orders: EKG w/ Interpretation (93000) Stress Echo (Stress Echo) Holter (Holter) TLB-BMP (Basic Metabolic Panel-BMET) (80048-METABOL) TLB-Hepatic/Liver Function Pnl (80076-HEPATIC) TLB-Magnesium (Mg) (83735-MG)  Patient Instructions: 1)  Labs today 2)  Your physician has requested that you have a stress echocardiogram. For further information please visit [email or web address]  Please follow instruction sheet as given. 3)  Your physician has recommended that you wear a holter  monitor.  Holter monitors are medical devices that record the heart's electrical activity. Doctors most often use these monitors to diagnose arrhythmias. Arrhythmias are problems with the speed or rhythm of the heartbeat. The monitor is a small, portable device. You can wear one while you do your normal daily activities. This is usually used to diagnose what is causing palpitations/syncope (passing out). 4)  Follow up in 3 months.

## 2010-08-03 NOTE — Consult Note (Signed)
Summary: [facility]  [facility]   Imported By: Marylou Mccoy 09/08/2009 12:33:37  _____________________________________________________________________  External Attachment:    Type:   Image     Comment:   External Document  Appended Document: [facility] Jonny Ruiz - just saw this. Do you want me to see him as well or just get stress test? just text me. thanks -dan  Appended Document: [facility] per Dr Gala Romney pt needs to have lexiscan myoview, will arrange

## 2010-08-03 NOTE — Letter (Signed)
Summary: Previsit letter  [facility]  [address] [place], [place] [address]   Phone: [phone]  Fax: [phone]       02/24/2010 MRN: [id number]  Timothy Aguirre [address] [place], [place]  [address]  Dear Timothy Aguirre,  Welcome to the Gastroenterology Division at [facility].    You are scheduled to see a nurse for your pre-procedure visit on 04/07/2010 at 4:30pm  on the 3rd floor at [facility], [address]  We ask that you try to arrive at our office 15 minutes prior to your appointment time to allow for check-in.  Your nurse visit will consist of discussing your medical and surgical history, your immediate family medical history, and your medications.    Please bring a complete list of all your medications or, if you prefer, bring the medication bottles and we will list them.  We will need to be aware of both prescribed and over the counter drugs.  We will need to know exact dosage information as well.  If you are on blood thinners (Coumadin, Plavix, Aggrenox, Ticlid, etc.) please call our office today/prior to your appointment, as we need to consult with your physician about holding your medication.   Please be prepared to read and sign documents such as consent forms, a financial agreement, and acknowledgement forms.  If necessary, and with your consent, a friend or relative is welcome to sit-in on the nurse visit with you.  Please bring your insurance card so that we may make a copy of it.  If your insurance requires a referral to see a specialist, please bring your referral form from your primary care physician.  No co-pay is required for this nurse visit.     If you cannot keep your appointment, please call [phone] to cancel or reschedule prior to your appointment date.  This allows [place] the opportunity to schedule an appointment for another patient in need of care.    Thank you for choosing [facility] for your medical  needs.  We appreciate the opportunity to care for you.  Please visit [place] at our website  to learn more about our practice.                     Sincerely.                                                                                                                   The Gastroenterology Division

## 2010-08-03 NOTE — Letter (Signed)
Summary: [facility] - CBC/TSH/C-Reac  [facility] - CBC/TSH/C-Reac   Imported By: Roderic Ovens 10/07/2009 16:10:33  _____________________________________________________________________  External Attachment:    Type:   Image     Comment:   External Document

## 2010-08-03 NOTE — Miscellaneous (Signed)
Summary: [facility] Previsit/prep  Clinical Lists Changes  Medications: Added new medication of MOVIPREP 100 GM  SOLR (PEG-KCL-NACL-NASULF-NA ASC-C) As per prep instructions. - Signed Rx of MOVIPREP 100 GM  SOLR (PEG-KCL-NACL-NASULF-NA ASC-C) As per prep instructions.;  #1 x 0;  Signed;  Entered by: Wyona Almas RN;  Authorized by: Mardella Layman MD [facility];  Method used: Electronically to [facility]*, [address] [place], [place]  [address], Ph: [phone], Fax: [phone] Observations: Added new observation of ALLERGY REV: Done (04/07/2010 16:39)    Prescriptions: MOVIPREP 100 GM  SOLR (PEG-KCL-NACL-NASULF-NA ASC-C) As per prep instructions.  #1 x 0   Entered by:   Wyona Almas RN   Authorized by:   Mardella Layman MD [facility]   Signed by:   Wyona Almas RN on 04/07/2010   Method used:   Electronically to        [facility]* (retail)       [address]       [place], [place]  [address]       Ph: [phone]       Fax: [phone]   RxID:   [id number]

## 2010-08-03 NOTE — Letter (Signed)
Summary: Letter  Letter   Imported By: Florinda Marker 10/19/2009 14:48:34  _____________________________________________________________________  External Attachment:    Type:   Image     Comment:   External Document

## 2010-08-03 NOTE — Miscellaneous (Signed)
Summary: clotest  Clinical Lists Changes  Problems: Added new problem of GERD (ICD-530.81) Orders: Added new Test order of TLB-H Pylori Screen Gastric Biopsy (83013-CLOTEST) - Signed 

## 2010-08-03 NOTE — Assessment & Plan Note (Signed)
Summary: F/U/VS   Referring Provider:  Dr Clelia Croft Primary Provider:  Dr Timothy Lasso  CC:  one month follow up.  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  Currently on doxy two times a day suppression after getting IV dapto for 6 weeks.  Doing great.   April 26th Wound has healed pretty well, except for some itching.  No fevers, chills, NS.  Energy level is back and he is gaining some weight.  He is going to gym and is back at work almost full tiime.  Preventive Screening-Counseling & Management  Alcohol-Tobacco     Alcohol drinks/day: occassionally during the week and on weekends     Alcohol type: wine     Smoking Status: never  Caffeine-Diet-Exercise     Caffeine use/day: coffee and sodas     Does Patient Exercise: yes     Type of exercise: gym     Exercise (avg: min/session): 30-60     Times/week: 4  Safety-Violence-Falls     Seat Belt Use: yes   Updated Prior Medication List: DOXYCYCLINE HYCLATE 100 MG CAPS (DOXYCYCLINE HYCLATE) Take 1 tablet by mouth two times a day NEXIUM 40 MG CPDR (ESOMEPRAZOLE MAGNESIUM) once daily MULTIVITAMINS   TABS (MULTIPLE VITAMIN) once daily TEMAZEPAM 15 MG CAPS (TEMAZEPAM) at bedtime FISH OIL   OIL (FISH OIL) once daily ASPIRIN 81 MG TBEC (ASPIRIN) Take one tablet by mouth daily AVODART 0.5 MG CAPS (DUTASTERIDE) take one capsule one time a day ENABLEX 7.5 MG XR24H-TAB (DARIFENACIN HYDROBROMIDE) take one tablet one time a day LIPITOR 20 MG TABS (ATORVASTATIN CALCIUM) take one tablet one time a day  Current Allergies (reviewed today): ! CEFAZOLIN SODIUM (CEFAZOLIN SODIUM) ! VANCOMYCIN ! * TAPE Past History:  Past Medical History: Last updated: 09/01/2009 1. Several prior surgeries, one on C-spine and one prior on his back.   2. GERD.   3. Hyperlipidemia.   4. BPH.   5. Chronic back pain.   Past Surgical History: Last updated: 09/29/2009 1.  Microdiskectomy in May 1998.   2. ACDF C4-5, C5-C6, C6-7, May 2001.   3. Redo decompression, July 30, 2009.  Family History: Last updated: 09/29/2009 noncontributory  Social History: Last updated: 09/29/2009 lives with wife.  Works as a Merchandiser, retail. Non-smoker.  Risk Factors: Alcohol Use: occassionally during the week and on weekends (12/01/2009) Caffeine Use: coffee and sodas (12/01/2009) Exercise: yes (12/01/2009)  Risk Factors: Smoking Status: never (12/01/2009)  Review of Systems       11 systems reviewed and negative except per HPI   Vital Signs:  Patient profile:   77 year old male Height:      72 inches (182.88 cm) Weight:      212.12 pounds (96.42 kg) BMI:     28.87 Temp:     98.1 degrees F (36.72 degrees C) oral Pulse rate:   70 / minute BP sitting:   143 / 90  (left arm)  Vitals Entered By: Wendall Mola CMA [name]) (Dec 01, 2009 9:51 AM) CC: one month follow up Is Patient Diabetic? No Pain Assessment Patient in pain? no      Nutritional Status BMI of 25 - 29 = overweight Nutritional Status Detail appetite "normal"  Does patient need assistance? Functional Status Self care Ambulation Normal Comments no missed doses of meds per patient   Physical Exam  General:  alert and well-developed.   Head:  normocephalic.   Mouth:  good dentition.   Neck:  supple.   Lungs:  normal respiratory effort and normal breath sounds.   Heart:  normal rate and regular rhythm.   Abdomen:  soft and non-tender.   Msk:  normal ROM and no joint tenderness.   Extremities:  no cce Neurologic:  alert & oriented X3, cranial nerves II-XII intact, and strength normal in all extremities.   Skin:  well healed incision Psych:  Oriented X3.     Impression & Recommendations:  Problem # 1:  OTH INFS INVLV BONE DZ CLASS ELSW OTH SPEC SITES (ICD-730.88)  He is doing great after prolonged and complicated abx course.  He is on suppressive two times a day doxy.   Will change to once daily doxy and then continue it lifelong.  HE will need to monitor for back pain or fevers chills or other sxs. If he has these would check an esr and crp and if elevated get an MRI>    His updated medication list for this problem includes:    Doxycycline Hyclate 100 Mg Caps (Doxycycline hyclate) .Marland Kitchen... Take 1 tablet by mouth two times a day    Aspirin 81 Mg Tbec (Aspirin) .Marland Kitchen... Take one tablet by mouth daily  Diagnostics Reviewed:  ESR: 5 (10/27/2009)   CRP: 0.2 (10/27/2009)   Orthopedic Consult:   Orders: Est. Patient Level III ([address])  Medications Added to Medication List This Visit: 1)  Avodart 0.5 Mg Caps (Dutasteride) .... Take one capsule one time a day 2)  Enablex 7.5 Mg Xr24h-tab (Darifenacin hydrobromide) .... Take one tablet one time a day 3)  Lipitor 20 Mg Tabs (Atorvastatin calcium) .... Take one tablet one time a day  Patient Instructions: 1)  FOllow up as needed. 2)  Monitor for back pain or fevers chills or night sweats.  If you have these go to get an ESR and CRP and if elevated will need an MRI. Prescriptions: DOXYCYCLINE HYCLATE 100 MG CAPS (DOXYCYCLINE HYCLATE) Take 1 tablet by mouth two times a day  #30 x 11   Entered and Authorized by:   Clydie Braun MD   Signed by:   Clydie Braun MD on 12/01/2009   Method used:   Print then Give to Patient   RxID:   [id number]

## 2010-08-03 NOTE — Assessment & Plan Note (Signed)
Summary: hsfu wound infectin need chart/kam   Visit Type:  Follow-up  CC:  hsfu wound infection.  History of Present Illness: This is a very healthy 77 year old white   male who underwent his a third spinal surgery on July 30, 2009, with   lumbar decompression and fusion.  His course was complicated several   days after discharge by a rash around the site of the wound that was in   cummerbund type distribution.  This may have been due to his tape or   some contact allergy with the surgery.  He had also received Ancef, and   once the rash spread to his whole body he was seen by Dermatology and   felt this was probably an Ancef rash.  He was started on prednisone and   the rash was resolving until 3 days prior to admission when he developed   fevers, 1 day prior to admission he noticed purulence draining from the   wound all of a sudden.  He was seen in ortho clinic and had a white   count of 23,000.    [facility] and had surgical   I and D done by Dr. Venetia Maxon Feb 16th .  This revealed purulence once the incision   was opened.  There was dehiscence of the fascia and purulent material   did go down to the hardware.  Bone graft was removed.  I discussed the   operative findings with Dr. Venetia Maxon and he says the hardware was left   place.  It did not appear to be encased and the infection seemed to be   somewhat stable.   I saw the patient in the hospital and cx grew MSSA and a few klebsiella. ESR 70 CRP 14.  Had a recurrent rash ? due to vanco so was changed to daptomycin and cipro by mouth.  Was d/ced on feb 18th and now here for f/u.  He remains on wound vac and per his RN it is improving rapidly.  Some drainage but little amount.  Has not seen Dr Rush Farmer since d.c.  No fevers chills ns, n.v.d. Picc working ok.  Still quite itchy all over - mainly around his wound.  No longer on prednisone Still on cubicin and cipro pills.   A few days ago he called me regarding  an episode of prolonged burping and chest pressure that he thinks was related to swallowing air.  He saw Dr Timothy Lasso and had ekg and will be getting a stress test soon.  Preventive Screening-Counseling & Management  Alcohol-Tobacco     Alcohol drinks/day: occassionally during the week and on weekends     Alcohol type: wine     Smoking Status: never  Caffeine-Diet-Exercise     Caffeine use/day: coffee and sodas     Does Patient Exercise: yes     Type of exercise: PT     Exercise (avg: min/session): 30-60     Times/week: <3  Safety-Violence-Falls     Seat Belt Use: yes   Updated Prior Medication List: CUBICIN 500 MG SOLR (DAPTOMYCIN)  CIPRO 500 MG TABS (CIPROFLOXACIN HCL) two times a day  Current Allergies (reviewed today): ! CEFAZOLIN SODIUM (CEFAZOLIN SODIUM) ! VANCOMYCIN Past History:  Family History: Last updated: 09/01/2009 Jaconita  Social History: Last updated: 09/01/2009 lives with wife.  Works as a Merchandiser, retail.  Risk Factors: Alcohol Use: occassionally during the week and on weekends (09/01/2009) Caffeine Use: coffee and  sodas (09/01/2009) Exercise: yes (09/01/2009)  Risk Factors: Smoking Status: never (09/01/2009)  Past Medical History: 1. Several prior surgeries, one on C-spine and one prior on his back.   2. GERD.   3. Hyperlipidemia.   4. BPH.   5. Chronic back pain.   Family History: Marty  Social History: lives with wife.  Works as a Merchandiser, retail.  Review of Systems       11 systems reviewed and negative except per HPI   Vital Signs:  Patient profile:   77 year old male Height:      72 inches (182.88 cm) Weight:      201.7 pounds (91.68 kg) BMI:     27.45 Temp:     97.3 degrees F (36.28 degrees C) oral Pulse rate:   101 / minute BP sitting:   126 / 76  (left arm)  Vitals Entered By: Baxter Hire) (September 01, 2009 11:09 AM) CC: hsfu wound infection Pain Assessment Patient in pain? no      Nutritional Status BMI of 25  - 29 = overweight Nutritional Status Detail appetite is pretty good per patient  Does patient need assistance? Functional Status Self care Ambulation Normal   Physical Exam  General:  alert and well-developed.   Head:  normocephalic and atraumatic.   Eyes:  vision grossly intact and pupils equal.   Mouth:  good dentition.   Neck:  supple.   Lungs:  normal respiratory effort, normal breath sounds, and no dullness.   Heart:  normal rate and regular rhythm.   Abdomen:  soft and non-tender.   Msk:  normal ROM and no joint tenderness.   Skin:  back incision covered with wound vac sponge and appears clean through the dressing with no surrounding redness or tenderness.  He has a few red splotcehs on his chest  picc site RUE wnl Psych:  Oriented X3.   Additional Exam:                                 FEW                                KLEBSIELLA OXYTOCA                                Performed at [facility] [address] [facility]  REPORT STATUS:                FINAL                                08/21/2009   ORGANISM:                     ABUNDANT                                STAPHYLOCOCCUS AUREUS  METHOD:                       MIC  CLINDAMYCIN:                  RESISTANT  ERYTHROMYCIN:                 >=  8                                RESISTANT  GENTAMICIN:                   <=0.5                                SENSITIVE  LEVOFLOXACIN:                 <=0.12                                SENSITIVE  OXACILLIN:                    0.5                                SENSITIVE  PENICILLIN:                   >=0.5                                RESISTANT  RIFAMPIN:                     <=0.5                                SENSITIVE  TRIMETH/SULFA:                <=10                                SENSITIVE  VANCOMYCIN:                   <=0.5                                SENSITIVE  TETRACYCLINE:                 <=1                                SENSITIVE  MOXIFLOXACIN:                  <=0.25                                SENSITIVE   ORGANISM:                     FEW                                KLEBSIELLA OXYTOCA  METHOD:                       MIC  AMPICILLIN:                   >=  32                                RESISTANT  AMPICILLIN/SULBACTAM:         8                                SENSITIVE  CEFAZOLIN:                    <=4                                SENSITIVE  CEFEPIME:                     <=1                                SENSITIVE  CEFTAZIDIME:                  <=1                                SENSITIVE  CEFTRIAXONE:                  <=1                                SENSITIVE  CIPROFLOXACIN:                <=0.25                                SENSITIVE  GENTAMICIN:                   <=1                                SENSITIVE  IMIPENEM:                     <=1              Impression & Recommendations:  Problem # 1:  OTH INFS INVLV BONE DZ CLASS ELSW OTH SPEC SITES (ICD-730.88) MSSA and Kleb Oxytoca on cxs but kelb was only on superficial cx and was "rare".  MSSA is likely real pathogen.  He is probably allergic to acef and had a ? rxn to vanco.   Will continue daptomycin and cipro for a full 6 wks course.  THen given that hardware was exposed at site of infection will cont tail of by mouth doxy two times a day for another month then consider change to once daily suppression Wound care with vac.   BW from 2/25 with wbc 12.8 aec 5%, cr 1.19 and ck 87.  No side effects from his abx. Continue weekly labs and monitoring for side effects  His updated medication list for this problem includes:    Cipro 500 Mg Tabs (Ciprofloxacin hcl) .Marland Kitchen..Marland Kitchen Two times a day  Problem # 2:  INSOMNIA (ICD-780.52) He is taking restoril as needed.  Problem # 3:  PRURITUS (ICD-698.9) He has no eosinophils on his cbc.  If persists will rec f/u with derm  Problem # 4:  ENCOUNTER FOR THERAPEUTIC DRUG MONITORING (ICD-V58.83) following his cpk and bw on cipro  and dapto  Medications Added to Medication List This Visit: 1)  Cubicin 500 Mg Solr (Daptomycin) 2)  Cipro 500 Mg Tabs (Ciprofloxacin hcl) .... Two times a day  Patient Instructions: 1)  Please schedule a follow-up appointment in 1 month. 2)  Continue to take ciprofloxacin  and daptomycin until follow up.  Prescriptions: CIPRO 500 MG TABS (CIPROFLOXACIN HCL) two times a day  #60 x 1   Entered and Authorized by:   Clydie Braun MD   Signed by:   Clydie Braun MD on 09/01/2009   Method used:   Print then Give to Patient   RxID:   [id number]

## 2010-08-03 NOTE — Progress Notes (Signed)
Summary: d/c dapto start doxycycline  Phone Note Outgoing Call   Summary of Call: [facility] [address]  spoke with wife.  He is still fatigued and has low grade fever to 100.  Saw Dr Rush Farmer yest (I spoke to him as well) . Wound looks great but his HR was irregular and he was fatigued.   I suggest that since he has reached 6 wks of therapy, wound looks great (per dr Rush Farmer), MRI is negative we will stop daptomycin and starting doxy again for suppression.  I hope that stopping the abx may help his fatigue and low grade temps . CXR reportedly engative.  If WBC persist and fever recurs will likely ct his chest and abd will have advanced remove the picc line as well. Initial call taken by: Clydie Braun MD,  September 29, 2009 2:34 PM    New/Updated Medications: DOXYCYCLINE HYCLATE 100 MG CAPS (DOXYCYCLINE HYCLATE) one by mouth two times a day Prescriptions: DOXYCYCLINE HYCLATE 100 MG CAPS (DOXYCYCLINE HYCLATE) one by mouth two times a day  #60 x 3   Entered and Authorized by:   Clydie Braun MD   Signed by:   Clydie Braun MD on 09/29/2009   Method used:   Electronically to        [facility] [address] [facility] (retail)       [address]       [place], [place]  [address]       Ph: [phone] or [phone]       Fax: [phone]   RxID:   [id number]

## 2010-08-03 NOTE — Progress Notes (Signed)
Summary: Stress Echo pre procedure  Phone Note Outgoing Call Call back at [facility] Phone [phone]   Call placed by: Rea College, CMA,  April 01, 2010 3:15 PM Call placed to: Patient Details for Reason: Confirm Appt. Summary of Call: Called with instructions for Stress Echo.

## 2010-08-03 NOTE — Assessment & Plan Note (Signed)
Summary: eval for pvc's   Visit Type:  Initial Consult Referring Provider:  Dr Gala Romney Primary Provider:  Dr Timothy Lasso   History of Present Illness: Timothy Aguirre is a pleasant 77 yo WM with a h/o HL and PACs who presents today for EP consultation regarding his PACs.  He reports being told that he had PACs several years ago.  He then had MRSA staff infection 3/11.  Upon follow-up by Dr Clelia Croft at [facility], he was observed to have frequent PACs.  He was also told by Dr Kerby Nora [facility] that his heart rate was in his 30s.  The patient was then referred to Dr Gala Romney for further evaluation.  He had stress echo obtained which revealed no evidence of ischemia.  He also had a 48 hour monitor placed which documented frequent ectopy.  He is now referred for further evaluation. Upon discussion, the patient states that he feels "great".  He exercises four times per week without difficulty.  The patient denies symptoms of palpitations, chest pain, shortness of breath, orthopnea, PND, lower extremity edema, dizziness, presyncope, recent syncope, or neurologic sequela.  He states that he did have an episode of dizziness followed by syncope 3 years ago at a cocktail party after having several drinks.  He reports LOC for several seconds with this episode.  He has had no further episodes since that time.   Current Medications (verified): 1)  Nexium 40 Mg Cpdr (Esomeprazole Magnesium) .... Once Daily 2)  Multivitamins   Tabs (Multiple Vitamin) .... Once Daily 3)  Fish Oil   Oil (Fish Oil) .... Once Daily 4)  Aspirin 81 Mg Tbec (Aspirin) .... Take One Tablet By Mouth Daily 5)  Avodart 0.5 Mg Caps (Dutasteride) .... Take One Capsule One Time A Day 6)  Enablex 7.5 Mg Xr24h-Tab (Darifenacin Hydrobromide) .... Take One Tablet One Time A Day 7)  Lipitor 20 Mg Tabs (Atorvastatin Calcium) .... Take One Tablet One Time A Day 8)  Otc Sleep Aide .... As Needed  Allergies: 1)  ! Cefazolin Sodium (Cefazolin Sodium) 2)   ! Vancomycin 3)  ! * Tape  Past History:  Past Medical History:  1. Several prior surgeries, one on C-spine and one prior on his back complicated by osteomyelitis  2. GERD.   3. Hyperlipidemia.   4. BPH.   5. Chronic back pain.   6. DJD  7. PACs  sleep study 10 years ago at [facility] revealed mild OSA and weight loss was advised  denies h/o rheumatic fever  Past Surgical History: 1. Microdiskectomy in May 1998.   2. ACDF C4-5, C5-C6, C6-7, May 2001.   3. Redo decompression, July 30, 2009.  R knee arthroscopy 15 years ago  Family History: father died of MI age 37 and had "enlarged heart" rheumatic fever mother lived to be 62 brother has a pacemaker  Social History: Pt lives in [place] with wife.   Works as a Merchandiser, retail. Non-smoker.  ETOH- 2- 3 oz wine each evening, several glasses on weekends.  Review of Systems       All systems are reviewed and negative except as listed in the HPI.   Vital Signs:  Patient profile:   77 year old male Height:      72 inches Weight:      216 pounds BMI:     29.40 Pulse rate:   71 / minute BP sitting:   124 / 76  (right arm)  Vitals Entered By: Laurance Flatten CMA (May 13, 2010 3:52 PM)  Physical Exam  General:  Well developed, well nourished, in no acute distress. Head:  normocephalic and atraumatic Eyes:  PERRLA/EOM intact; conjunctiva and lids normal. Mouth:  Teeth, gums and palate normal. Oral mucosa normal. Neck:  Neck supple, no JVD. No masses, thyromegaly or abnormal cervical nodes. Lungs:  Clear bilaterally to auscultation and percussion. Heart:  regularly irregular rhythm, no m/r/g Abdomen:  Bowel sounds positive; abdomen soft and non-tender without masses, organomegaly, or hernias noted. No hepatosplenomegaly. Msk:  Back normal, normal gait. Muscle strength and tone normal. Pulses:  pulses normal in all 4 extremities Extremities:  No clubbing or cyanosis. Neurologic:  Alert and oriented x 3. Skin:   Intact without lesions or rashes. Cervical Nodes:  no significant adenopathy Psych:  Normal affect.    TE Echocardiogram  Procedure date:  09/18/2009  Findings:        - Left ventricle: Systolic function was normal. The estimated     ejection fraction was in the range of 60% to 65%. Wall motion was     normal; there were no regional wall motion abnormalities.   - Aortic valve: Trivial regurgitation.   - Mitral valve: Mild regurgitation.   - Atrial septum: There was an atrial septal aneurysm.   Impressions:    - Thickening of MV leaflet tip; cannot exclude vegetation; clinical     correlation recommended and repeat TEE in six weeks if clinically     indicated. Note patient presently on antibiotics.   Transesophageal echocardiography. 2D and color Doppler. Patient   status: Outpatient. Location: Endoscopy.   Echocardiogram  Procedure date:  04/02/2010  Findings:       Study Conclusions    - Stress ECG conclusions: The stress ECG was normal.   - Staged echo: There was no echocardiographic evidence for     stress-induced ischemia.   Impressions:    - Normal study after maximal exercise.   Bruce protocol. Stress echocardiography. Height: Height: 182.9cm.   Height: 72in. Weight: Weight: 101.4kg. Weight: 223lb. Body mass   index: BMI: 30.3kg/m^2. Body surface area: BSA: 2.41m^2. Blood   pressure: 111/71. Patient status: Outpatient.  Prepared and Electronically Authenticated by    Dietrich Pates, MD   [id number]    Holter Monitor  Procedure date:  04/20/2010  Findings:      48 hour holter predominant rhythm is sinus with PACs in a bigeminal pattern,  rare episodes of nonsustained atrial tachycardia are observed no atrial fibrillation documented no pauses or brady arrhythmias  79k PACs  EKG  Procedure date:  03/10/2010  Findings:      sinus rhythm with PACs in bigeminy, incomplete RBBB  Impression & Recommendations:  Problem # 1:  ATRIAL  PREMATURE BEATS (ICD-427.61) The patient has very frequent but asymptomatic monomorphic premature atrial contractions in a bigeminal pattern.  He also has episodes of asymptomatic nonsustained atrial  tachycardia observed on holter.  His EF is preserved.   Therapeutic strategies for pacs and nonsustained atrial tachycardia  including medicine and ablation were discussed in detail with the patient and his spouse today. Risk, benefits, and alternatives to EP study and radiofrequency ablation were also discussed in detail today.   As his is not symptomatic at this time, I discussed options of observation off medicines with repeat echo in 6 months vs initiation of verapamil.  He does not want to start verapamil at this time.  He is also very clear that he has no interest in ablation at  this time.  I would not recommend that he proceed with ablation unless he has failed medical therapy and has symptomatic issues. He is instructed to contact my office if he becomes symptomatic.  We would then consider verapamil vs flecainide. We will repeat echo in 6 months.  He will follow with Dr Gala Romney.  Patient Instructions: 1)  Your physician wants you to follow-up in: 6 months with Dr Johney Frame   Bonita Quin will receive a reminder letter in the mail two months in advance. If you don't receive a letter, please call our office to schedule the follow-up appointment.

## 2010-08-03 NOTE — Progress Notes (Signed)
Summary: 48 holter monitor  Phone Note Outgoing Call Call back at Home Phone [phone] Call back at [phone]   Call placed to: Patient Action Taken: Phone Call Completed Summary of Call: Pt. scheduled 04/20/10 9:00 am for 48 holter monitor. Stanton Kidney, EMT-P  April 13, 2010 5:27 PM

## 2010-08-03 NOTE — Assessment & Plan Note (Signed)
Summary: np6/chest discomfort/jml   Visit Type:  Initial Consult Referring Provider:  Dr Clelia Croft Primary Provider:  Dr Timothy Lasso  CC:  irregular heart beat.  History of Present Illness: Timothy Aguirre is a 77 y/o male with h/o degenerative disk disease s/p multiple back and neck surgeries, GERD and hyperlipidemia referred by Dr. Timothy Lasso for evaluation of CP.  Denies any h/o known cardiac disease. Has never had a cath. Had stress test in 2006 but doesn't remember results. Thinks it was normal.  In January, underwent redo lumbar decompression with fusion which was complicated by staph hardware infection and had to be debrided in February and was place on IV antiobiotics. He was having some CP posteropreratively which he thinks was gas and was supposed to see me for a stress test. However, developed recurrent fevers and question of a new murmur so underwent TEE by Dr. Jens Som while I was in [place] about 2 weeks ago.  TEE which I reviewed with Drs. Jessy Oto and McLean: EF 60-65%.  No obvious vegetation or abscess but MIV a lttile thick. Mild MR. Trivial TR.  Was scheduled to see me tomorrow but was at Dr. Fredrich Birks office yesterday and was tachycardic so appt moved up to today.  In early March was feling better. Walking 2x/day. However since that time has felt worse. Feels washed out. Continues with low-grade temps. No night sweats. ID concerned for smoldering infection versus reaction to antiobiotic.Marland Kitchen Wound healing well. MRI of spine looked good. Home health nurse has been checking HR and typically in the 50 range but now jumped up to 100. He doesn't feel it. Last WBC yesterday 15.5 (down from 16k). No CP. Currently. Does get some SOB.   He is quite stressed because he only has a certain amount of time to take some accoutning test online and he is not feeling up to it.      Problems Prior to Update: 1)  Fatigue, Acute  (ICD-780.79) 2)  Chest Pain Unspecified  (ICD-786.50) 3)  Encounter For Therapeutic Drug  Monitoring  (ICD-V58.83) 4)  Pruritus  (ICD-698.9) 5)  Insomnia  (ICD-780.52) 6)  Oth Infs Invlv Bone Dz Class [place] Oth Spec Sites  (ICD-730.88)  Medications Prior to Update: 1)  Cubicin 500 Mg Solr (Daptomycin) 2)  Cipro 500 Mg Tabs (Ciprofloxacin Hcl) .... Two Times A Day 3)  Doxycycline Hyclate 100 Mg Caps (Doxycycline Hyclate) .... One By Mouth Two Times A Day  Current Medications (verified): 1)  Doxycycline Hyclate 100 Mg Caps (Doxycycline Hyclate) .... One By Mouth Two Times A Day 2)  Nexium 40 Mg Cpdr (Esomeprazole Magnesium) .... Once Daily 3)  Multivitamins   Tabs (Multiple Vitamin) .... Once Daily 4)  Tramadol Hcl 50 Mg Tabs (Tramadol Hcl) .... Three Times A Day As Needed 5)  Hydroxyzine Hcl 25 Mg Tabs (Hydroxyzine Hcl) .... Two Times A Day As Needed 6)  Temazepam 15 Mg Caps (Temazepam) .... At Bedtime 7)  Fish Oil   Oil (Fish Oil) .... Once Daily 8)  Aspirin 81 Mg Tbec (Aspirin) .... Take One Tablet By Mouth Daily  Allergies (verified): 1)  ! Cefazolin Sodium (Cefazolin Sodium) 2)  ! Vancomycin  Past History:  Past Medical History: Last updated: 09/01/2009 1. Several prior surgeries, one on C-spine and one prior on his back.   2. GERD.   3. Hyperlipidemia.   4. BPH.   5. Chronic back pain.   Past Surgical History: Last updated: 09/29/2009 1. Microdiskectomy in May 1998.   2. ACDF C4-5,  C5-C6, C6-7, May 2001.   3. Redo decompression, July 30, 2009.  Family History: Last updated: 09/29/2009 noncontributory  Social History: Last updated: 09/29/2009 lives with wife.  Works as a Merchandiser, retail. Non-smoker.  Risk Factors: Alcohol Use: occassionally during the week and on weekends (09/24/2009) Caffeine Use: coffee and sodas (09/24/2009) Exercise: no (09/24/2009)  Risk Factors: Smoking Status: never (09/24/2009)  Family History: Reviewed history from 09/01/2009 and no changes required. noncontributory  Social History: Reviewed history from  09/01/2009 and no changes required. lives with wife.  Works as a Merchandiser, retail. Non-smoker.  Review of Systems       As per HPI and past medical history; otherwise all systems negative.   Vital Signs:  Patient profile:   77 year old male Height:      72 inches Weight:      205 pounds BMI:     27.90 Temp:     99.0 degrees F oral Pulse rate:   99 / minute BP sitting:   128 / 54  (left arm) Cuff size:   regular  Vitals Entered By: Hardin Negus, RMA (September 29, 2009 3:33 PM)  Physical Exam  General:  Gen: well appearing. no resp difficulty HEENT: normal Neck: supple. no JVD. Carotids 2+ bilat; no bruits.  Cor: PMI nondisplaced. Regular rate & rhythm. No rubs, gallops, murmur. Lungs: clear Abdomen: soft, nontender, nondistended. No hepatosplenomegaly. No bruits or masses. Good bowel sounds. Small wound vac over spinal incision borders look clear. Extremities: no cyanosis, clubbing, rash, edema. PICC site ok Neuro: alert & orientedx3, cranial nerves grossly intact. moves all 4 extremities w/o difficulty. affect pleasant    Impression & Recommendations:  Problem # 1:  TACHYCARDIA (ICD-785) This is sinus tach and likely due to his underlying illness. I spoke with Dr. Sampson Goon by phone who also raised the possibility of an advers reaction to the antibiotic whis is going to be stopped tomorrow. Agree with plan to watch and wait to see if this improves off antiobiotic. If not can consider CT or possibly tagged white cell scan. No evidence of atrial fib.  Problem # 2:  FATIGUE, ACUTE (ICD-780.79) Multifactorial. No obvious cardiology etiology at this point. Eventually can consider stress test given intermittent CP previously.  Other Orders: EKG w/ Interpretation (93000)  Patient Instructions: 1)  Follow up in 1 month

## 2010-08-03 NOTE — Progress Notes (Signed)
Summary: appt  Phone Note Call from Patient Call back at [phone]   Caller: Patient Reason for Call: Talk to Nurse Summary of Call: pt was unaware that he had appt on 5/2, does he still need to keep him appt? last ofc note in March states follow up in , pt states that the dr did not tell him that, request call back Initial call taken by: Migdalia Dk,  October 29, 2009 8:21 AM  Follow-up for Phone Call        pt thought Dr Gala Romney told him he didn't need f/u appt, will ask DB and let pt know Meredith Staggers, RN  October 29, 2009 11:18 AM   appt cx'd ok per Dr Gala Romney Meredith Staggers, RN  Nov 02, 2009 2:11 PM

## 2010-08-03 NOTE — Progress Notes (Signed)
Summary: Calling regarding EKG--  Phone Note Call from Patient Call back at Work Phone [phone] Call back at [phone]   Caller: Patient Summary of Call: Calling regarding  EKG Initial call taken by: Judie Grieve,  January 27, 2010 4:02 PM  Follow-up for Phone Call        talked with pt by telephone--he stated he saw Dr Kerby Nora in [place] this morning and had an EKG done that showed some blips --pt dropped the EKG off today around 1 PM for Dr Gala Romney to review Katina Dung, RN, BSN  January 27, 2010 4:16 PM --Elita Quick has EKG for Dr Gala Romney to review Katina Dung, RN, BSN  January 27, 2010 4:28 PM  Dr Gala Romney reviewed  EKG --atrial bigeminy rate 68--Dr Bensimhon recommended pt return for BMP and magnesium --I talked with pt--pt unable to come for lab tomorrow--on 01/15/10 he  lab at Dr Ferd Hibbs office and  K was 4.6--he is going out of town for 2 weeks and has decided that he will return for BMP and MG here on 02/16/10

## 2010-08-03 NOTE — Letter (Signed)
Summary: Generic on Letterhead Paper  [facility]  [address]   [place], [place] [address]   Phone: [phone]  Fax: [phone]     Today's Date:  September 24, 2009  Re:  JASEAN AMBROSIA [facility]          Sincerely,    Clydie Braun MD

## 2010-08-03 NOTE — Assessment & Plan Note (Signed)
Summary: F/U/VS   CC:  1 month follow up and some pain when getting up in mornings x 4 days.  History of Present Illness: This is a very healthy 77 year old white   male who underwent his a third spinal surgery on July 30, 2009, with   lumbar decompression and fusion.  His course was complicated several   days after discharge by a rash around the site of the wound that was in   cummerbund type distribution.  This may have been due to his tape or   some contact allergy with the surgery.  He had also received Ancef, and   once the rash spread to his whole body he was seen by Dermatology and   felt this was probably an Ancef rash.  He was started on prednisone and   the rash was resolving until 3 days prior to admission when he developed   fevers, 1 day prior to admission he noticed purulence draining from the   wound all of a sudden.  He was seen in ortho clinic and had a white   count of 23,000.    [facility] and had surgical   I and D done by Dr. Venetia Maxon Feb 16th .  This revealed purulence once the incision   was opened.  There was dehiscence of the fascia and purulent material   did go down to the hardware.  Bone graft was removed.  I discussed the   operative findings with Dr. Venetia Maxon and he says the hardware was left   place.  It did not appear to be encased and the infection seemed to be   somewhat stable.   I saw the patient in the hospital and cx grew MSSA and a few klebsiella. ESR 70 CRP 14.  Had a recurrent rash ? due to vanco so was changed to daptomycin and cipro by mouth.  Was d/ced on feb 18th  Has been on cubicin and cipro pills.   Since last seen he has felt quite poorly  has weakness and low energy.  Is winded all the time.   This started [facility] 10th then he had fever to 102 and we pulled picc and changed it.  Wound is healing.  Has seen Dr Venetia Maxon who wants to do an MRI.  is having pain in back with getting up.  Had TEE which was neg but MV was  thickened  Preventive Screening-Counseling & Management  Alcohol-Tobacco     Alcohol drinks/day: occassionally during the week and on weekends     Alcohol type: wine     Smoking Status: never  Caffeine-Diet-Exercise     Caffeine use/day: coffee and sodas     Does Patient Exercise: no     Type of exercise: PT completed  Safety-Violence-Falls     Seat Belt Use: yes   Updated Prior Medication List: CUBICIN 500 MG SOLR (DAPTOMYCIN)  CIPRO 500 MG TABS (CIPROFLOXACIN HCL) two times a day  Current Allergies: ! CEFAZOLIN SODIUM (CEFAZOLIN SODIUM) ! VANCOMYCIN Past History:  Past Medical History: Last updated: 09/01/2009 1. Several prior surgeries, one on C-spine and one prior on his back.   2. GERD.   3. Hyperlipidemia.   4. BPH.   5. Chronic back pain.   Family History: Last updated: 09/01/2009 [place]  Social History: Last updated: 09/01/2009 lives with wife.  Works as a Merchandiser, retail.  Risk Factors: Alcohol Use: occassionally during the week and on weekends (  09/24/2009) Caffeine Use: coffee and sodas (09/24/2009) Exercise: no (09/24/2009)  Risk Factors: Smoking Status: never (09/24/2009)  Review of Systems       11 systems reviewed and negative except per HPI   Vital Signs:  Patient profile:   77 year old male Height:      72 inches (182.88 cm) Weight:      205.0 pounds (93.18 kg) BMI:     27.90 Temp:     97.3 degrees F (36.28 degrees C) oral Pulse rate:   53 / minute BP sitting:   134 / 75  (left arm)  Vitals Entered By: Baxter Hire) (September 24, 2009 10:57 AM) CC: 1 month follow up and some pain when getting up in mornings x 4 days Pain Assessment Patient in pain? no      Nutritional Status BMI of 25 - 29 = overweight Nutritional Status Detail appetite is moderate per patient  Does patient need assistance? Functional Status Self care Ambulation Normal   Physical Exam  General:  alert and well-developed.   Head:  normocephalic and  atraumatic.   Eyes:  vision grossly intact, pupils equal, and pupils round.   Mouth:  fair dentition.   Neck:  supple.   Lungs:  normal respiratory effort, no intercostal retractions, no accessory muscle use, and normal breath sounds.   Heart:  normal rate, regular rhythm, and no murmur.   Abdomen:  soft, non-tender, and normal bowel sounds.   Msk:  normal ROM, no joint tenderness, and no joint swelling.   Extremities:  no cce Neurologic:  alert & oriented X3, cranial nerves II-XII intact, and strength normal in all extremities.   Skin:  no rashes.    picc site wnl Psych:  Oriented X3, memory intact for recent and remote, and normally interactive.     Impression & Recommendations:  Problem # 1:  OTH INFS INVLV BONE DZ CLASS ELSW OTH SPEC SITES (ICD-730.88) MSSA and Kleb Oxytoca on cxs but kleb was only on superficial cx and was "rare".  MSSA is likely real pathogen.  He is probably allergic to acef and had a ? rxn to vanco.   Has been on daptomycin and cipro for about 4 wks now.  He is actually feeling rather ill since an episode of fever 2 weeks ago that lead to removal of his picc line and replacement with another one.  His sxs are very non specific however.  my concerns are for either a residual deep infection or abscess vs abx reaction.  HIs WBC is increasing and esr crp pending.   Will get MRI - I have discussed with Dr Rush Farmer who will review the MRI.   After iV abx given that hardware was exposed at site of infection will need tail of by mouth doxy two times a day for another month then consider change to once daily suppression   BW from 2/25 with wbc 12.8 aec 5%, cr 1.19 and ck 87.  BW 3/25-  Orders: T-C-Reactive Protein [id number]) T-CBC w/Diff [phone]) T-Comprehensive Metabolic Panel [id number]) T-Sed Rate (Automated) ([id number]) MRI with & without Contrast (MRI w&w/o Contrast)  Problem # 2:  FATIGUE, ACUTE (ICD-780.79) will check bw and advise to increase  fluids. Orders: T-Urinalysis Dipstick only (28413KG)  Patient Instructions: 1)  Please schedule a follow-up appointment in 1 month. Process Orders Check Orders Results:     [facility]: Check successful Tests Sent for requisitioning (September 24, 2009 4:38 PM):     09/24/2009: [facility] -- T-C-Reactive Protein [id number] (signed)     09/24/2009: [facility] -- T-CBC w/Diff [[id number]] (signed)     09/24/2009: [facility] -- T-Comprehensive Metabolic Panel [[id number]] (signed)     09/24/2009: [facility] -- T-Sed Rate (Automated) [id number] (signed)   Laboratory Results   Urine Tests  Date/Time Received: 09-24-09/12:00 Date/Time Reported: 09-24-09/2:44  Routine Urinalysis   Color: yellow Appearance: Clear Glucose: negative   (Normal Range: Negative) Bilirubin: negative   (Normal Range: Negative) Ketone: trace (5)   (Normal Range: Negative) Spec. Gravity: >=1.030   (Normal Range: 1.003-1.035) Blood: negative   (Normal Range: Negative) pH: 5.0   (Normal Range: 5.0-8.0) Protein: trace   (Normal Range: Negative) Urobilinogen: 0.2   (Normal Range: 0-1) Nitrite: negative   (Normal Range: Negative) Leukocyte Esterace: negative   (Normal Range: Negative)

## 2010-08-03 NOTE — Progress Notes (Signed)
Summary: question re appt  Phone Note Call from Patient   Caller: Patient Reason for Call: Talk to Nurse Summary of Call: pt seen today and wife would like a call re appt-pls call [phone] [place] Initial call taken by: Glynda Jaeger,  March 04, 2010 3:57 PM  Follow-up for Phone Call        spoke w/pts wife Meredith Staggers, RN  March 04, 2010 5:00 PM

## 2010-08-03 NOTE — Letter (Signed)
Summary: [facility] Instructions  [facility]  [address] [place], [place] [address]   Phone: [phone]  Fax: [phone]       Timothy Aguirre    12-Feb-1934    MRN: [id number]        Procedure Day /Date: Monday 04-19-10     Arrival Time: 2:00 p.m.     Procedure Time: 3:00 p.m.     Location of Procedure:                    _x _  [facility] (4th Floor)                       PREPARATION FOR COLONOSCOPY WITH MOVIPREP   Starting 5 days prior to your procedure  04-14-10 do not eat nuts, seeds, popcorn, corn, beans, peas,  salads, or any raw vegetables.  Do not take any fiber supplements (e.g. Metamucil, Citrucel, and Benefiber).  THE DAY BEFORE YOUR PROCEDURE         DATE:  04-18-10  DAY: Sunday  1.  Drink clear liquids the entire day-NO SOLID FOOD  2.  Do not drink anything colored red or purple.  Avoid juices with pulp.  No orange juice.  3.  Drink at least 64 oz. (8 glasses) of fluid/clear liquids during the day to prevent dehydration and help the prep work efficiently.  CLEAR LIQUIDS INCLUDE: Water Jello Ice Popsicles Tea (sugar ok, no milk/cream) Powdered fruit flavored drinks Coffee (sugar ok, no milk/cream) Gatorade Juice: apple, white grape, white cranberry  Lemonade Clear bullion, consomm, broth Carbonated beverages (any kind) Strained chicken noodle soup Hard Candy                             4.  In the morning, mix first dose of MoviPrep solution:    Empty 1 Pouch A and 1 Pouch B into the disposable container    Add lukewarm drinking water to the top line of the container. Mix to dissolve    Refrigerate (mixed solution should be used within 24 hrs)  5.  Begin drinking the prep at 5:00 p.m. The MoviPrep container is divided by 4 marks.   Every 15 minutes drink the solution down to the next mark (approximately 8 oz) until the full liter is complete.   6.  Follow completed prep with 16 oz of clear liquid of your choice  (Nothing red or purple).  Continue to drink clear liquids until bedtime.  7.  Before going to bed, mix second dose of MoviPrep solution:    Empty 1 Pouch A and 1 Pouch B into the disposable container    Add lukewarm drinking water to the top line of the container. Mix to dissolve    Refrigerate  THE DAY OF YOUR PROCEDURE      DATE:  04-19-10  DAY:  Monday  Beginning at  10:00 a.m. (5 hours before procedure):         1. Every 15 minutes, drink the solution down to the next mark (approx 8 oz) until the full liter is complete.  2. Follow completed prep with 16 oz. of clear liquid of your choice.    3. You may drink clear liquids until  1:00 p.m.  (2 HOURS BEFORE PROCEDURE).   MEDICATION INSTRUCTIONS  Unless otherwise instructed, you should take regular prescription medications with a small sip of water  as early as possible the morning of your procedure.        OTHER INSTRUCTIONS  You will need a responsible adult at least 75 years of age to accompany you and drive you home.   This person must remain in the waiting room during your procedure.  Wear loose fitting clothing that is easily removed.  Leave jewelry and other valuables at home.  However, you may wish to bring a book to read or  an iPod/MP3 player to listen to music as you wait for your procedure to start.  Remove all body piercing jewelry and leave at home.  Total time from sign-in until discharge is approximately 2-3 hours.  You should go home directly after your procedure and rest.  You can resume normal activities the  day after your procedure.  The day of your procedure you should not:   Drive   Make legal decisions   Operate machinery   Drink alcohol   Return to work  You will receive specific instructions about eating, activities and medications before you leave.    The above instructions have been reviewed and explained to me by   Wyona Almas RN  April 07, 2010 5:33 PM     I  fully understand and can verbalize these instructions _____________________________ Date _________

## 2010-08-03 NOTE — Miscellaneous (Signed)
Summary: [facility]: Home Health Cert. & Plan Of Care  [facility]: Home Health Cert. & Plan Of Care   Imported By: Florinda Marker 11/06/2009 10:17:31  _____________________________________________________________________  External Attachment:    Type:   Image     Comment:   External Document

## 2010-08-03 NOTE — Miscellaneous (Signed)
Summary: Appointment Canceled  Appointment status changed to canceled by [employer] on 10/07/2009 4:34 PM.  Cancellation Comments --------------------- fukam  Appointment Information ----------------------- Appt Type:  ID OFFICE VISIT      Date:  Monday, October 19, 2009      Time:  9:15 AM for 15 min   Urgency:  Routine   Made By:  Pearson Grippe  To Visit:  [id number]    Reason:  fukam  Appt Comments ------------- -- 10/07/09 16:34: ([facility]) CANCELED -- fukam -- 10/07/09 16:32: ([facility]) BOOKED -- Routine ID OFFICE VISIT at 10/19/2009 9:15 AM for 15 min fukam

## 2010-08-03 NOTE — Miscellaneous (Signed)
Summary: [facility]: Dr. Jeri Cos [facility]: Dr. Kerby Nora   Imported By: Florinda Marker 10/08/2009 16:25:50  _____________________________________________________________________  External Attachment:    Type:   Image     Comment:   External Document

## 2010-08-03 NOTE — Assessment & Plan Note (Signed)
Summary: F/U/APPT/PER DR Carollynn Pennywell/VS   Referring Provider:  Dr Clelia Croft Primary Provider:  Dr Timothy Lasso  CC:  follow-up visit.  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  Currently on doxy two times a day suppression after getting IV dapto for 6 weeks.  Doing great.   April 26th Wound has healed pretty well, except for some itching.  No fevers, chills, NS.  Energy level is back and he is gaining some weight.  He is going to gym and is back at work almost full tiime.  Preventive Screening-Counseling & Management  Alcohol-Tobacco     Alcohol drinks/day: occassionally during the week and on weekends     Alcohol type: wine     Smoking Status: never  Caffeine-Diet-Exercise     Caffeine use/day: coffee and sodas     Does Patient Exercise: no     Type of exercise: PT completed     Exercise (avg: min/session): 30-60     Times/week: <3  Safety-Violence-Falls     Seat Belt Use: yes   Updated Prior Medication List: DOXYCYCLINE HYCLATE 100 MG CAPS (DOXYCYCLINE HYCLATE) one by mouth two times a day NEXIUM 40 MG CPDR (ESOMEPRAZOLE MAGNESIUM) once daily MULTIVITAMINS   TABS (MULTIPLE VITAMIN) once daily TEMAZEPAM 15 MG CAPS (TEMAZEPAM) at bedtime FISH OIL   OIL (FISH OIL) once daily ASPIRIN 81 MG TBEC (ASPIRIN) Take one tablet by mouth daily AMBIEN 5 MG TABS (ZOLPIDEM TARTRATE) one by mouth at bedtime as needed insomnia  Current Allergies (reviewed today): ! CEFAZOLIN SODIUM (CEFAZOLIN SODIUM) ! VANCOMYCIN ! * TAPE Past History:  Past Medical History: Last updated: 09/01/2009 1. Several prior surgeries, one on C-spine and one prior on his back.   2. GERD.   3. Hyperlipidemia.   4. BPH.   5. Chronic back pain.   Past Surgical History: Last updated: 09/29/2009 1. Microdiskectomy in May 1998.   2. ACDF C4-5, C5-C6, C6-7, May 2001.   3. Redo decompression, July 30, 2009.  Family History: Last  updated: 09/29/2009 noncontributory  Social History: Last updated: 09/29/2009 lives with wife.  Works as a Merchandiser, retail. Non-smoker.  Risk Factors: Alcohol Use: occassionally during the week and on weekends (10/27/2009) Caffeine Use: coffee and sodas (10/27/2009) Exercise: no (10/27/2009)  Risk Factors: Smoking Status: never (10/27/2009)  Review of Systems       11 systems reviewed and negative except per HPI   Vital Signs:  Patient profile:   77 year old male Height:      72 inches (182.88 cm) Weight:      208.8 pounds (94.91 kg) BMI:     28.42 Temp:     97.8 degrees F (36.56 degrees C) oral Pulse rate:   73 / minute BP sitting:   130 / 83  (right arm)  Vitals Entered By: Baxter Hire) (October 27, 2009 9:36 AM) CC: follow-up visit Pain Assessment Patient in pain? no      Nutritional Status BMI of 25 - 29 = overweight Nutritional Status Detail appetite is good per patient  Does patient need assistance? Functional Status Self care Ambulation Normal   Physical Exam  General:  alert, well-developed, and well-nourished.   Head:  normocephalic and atraumatic.   Eyes:  vision grossly intact.   Mouth:  fair dentition.   Neck:  supple.   Lungs:  normal respiratory effort and normal breath sounds.   Heart:  normal rate,  regular rhythm, and no murmur.   Abdomen:  soft and non-tender.   Msk:  normal ROM, no joint tenderness, and no joint swelling.   Extremities:  no cce Neurologic:  alert & oriented X3 and cranial nerves II-XII intact.   Skin:  no rashes.   back incison completely healed. Cervical Nodes:  no anterior cervical adenopathy and no posterior cervical adenopathy.   Psych:  Oriented X3 and memory intact for recent and remote.     Impression & Recommendations:  Problem # 1:  OTH INFS INVLV BONE DZ CLASS ELSW OTH SPEC SITES (ICD-730.88)  The following medications were removed from the medication list:    Tramadol Hcl 50 Mg Tabs (Tramadol  hcl) .Marland Kitchen... Three times a day as needed His updated medication list for this problem includes:    Doxycycline Hyclate 100 Mg Caps (Doxycycline hyclate) ..... One by mouth two times a day    Aspirin 81 Mg Tbec (Aspirin) .Marland Kitchen... Take one tablet by mouth daily  Orders: T-CBC w/Diff ([id number]) T-Sed Rate (Automated) ([id number]) T-C-Reactive Protein ([id number]) Est. Patient Level IV (62952)  Problem # 2:  CUTANEOUS ERUPTIONS, DRUG-INDUCED (ICD-693.0)  I suspect he is allergic to tape whcih may have triggered this whole event after his first surgey.  Will list as an allergy.  Can use tegaderm instead for now.    Medications Added to Medication List This Visit: 1)  Bactroban 2 % Oint (Mupirocin) .... Apply to nares twice daily for 2 weeks. 2)  Hibiclens 4 % Liqd (Chlorhexidine gluconate) .... Shower with it daily for 2 weeks  Patient Instructions: 1)  Hibiclens 4% body wash once a day for 2 weeks. 2)  Bactroban nasal ointment to nose twice a day for 2 weeks. 3)  Follow up at end of May. Prescriptions: DOXYCYCLINE HYCLATE 100 MG CAPS (DOXYCYCLINE HYCLATE) one by mouth two times a day  #90 x 11   Entered and Authorized by:   Clydie Braun MD   Signed by:   Clydie Braun MD on 10/27/2009   Method used:   Print then Give to Patient   RxID:   [id number] DOXYCYCLINE HYCLATE 100 MG CAPS (DOXYCYCLINE HYCLATE) one by mouth two times a day  #60 x 11   Entered and Authorized by:   Clydie Braun MD   Signed by:   Clydie Braun MD on 10/27/2009   Method used:   Print then Give to Patient   RxID:   [id number] HIBICLENS 4 % LIQD (CHLORHEXIDINE GLUCONATE) shower with it daily for 2 weeks  #1 x 2   Entered and Authorized by:   Clydie Braun MD   Signed by:   Clydie Braun MD on 10/27/2009   Method used:   Print then Give to Patient   RxID:   [id number] BACTROBAN 2 % OINT (MUPIROCIN) apply to nares twice daily for 2 weeks.  #1 x 2   Entered and Authorized by:    Clydie Braun MD   Signed by:   Clydie Braun MD on 10/27/2009   Method used:   Print then Give to Patient   RxID:   [id number]  Process Orders Check Orders Results:     [facility]: Check successful Tests Sent for requisitioning (October 27, 2009 10:13 AM):     10/27/2009: [facility] -- T-CBC w/Diff [[id number]] (signed)     10/27/2009: [facility] -- T-Sed Rate (Automated) [[id number]] (signed)     10/27/2009: [facility] -- T-C-Reactive Protein [id number] (signed)

## 2010-08-03 NOTE — Miscellaneous (Signed)
[facility]   [facility]:   Imported By: Florinda Marker 11/03/2009 16:16:49  _____________________________________________________________________  External Attachment:    Type:   Image     Comment:   External Document

## 2010-09-20 LAB — CBC
HCT: 47.5 % (ref 39.0–52.0)
Hemoglobin: 16.2 g/dL (ref 13.0–17.0)
MCV: 89.3 fL (ref 78.0–100.0)
WBC: 10.1 10*3/uL (ref 4.0–10.5)

## 2010-09-20 LAB — TYPE AND SCREEN: Antibody Screen: NEGATIVE

## 2010-09-20 LAB — BASIC METABOLIC PANEL
BUN: 18 mg/dL (ref 6–23)
Chloride: 100 mEq/L (ref 96–112)
GFR calc non Af Amer: >60 mL/min (ref >60)
Glucose, Bld: 101 mg/dL — ABNORMAL HIGH (ref 70–99)
Potassium: 4.4 mEq/L (ref 3.5–5.1)
Sodium: 136 mEq/L (ref 135–145)

## 2010-09-20 LAB — ABO/RH: ABO/RH(D): O POS

## 2010-09-24 LAB — GRAM STAIN

## 2010-09-24 LAB — WOUND CULTURE

## 2010-09-24 LAB — COMPREHENSIVE METABOLIC PANEL
ALT: 47 U/L (ref 0–53)
AST: 42 U/L — ABNORMAL HIGH (ref 0–37)
Albumin: 3 g/dL — ABNORMAL LOW (ref 3.5–5.2)
Alkaline Phosphatase: 133 U/L — ABNORMAL HIGH (ref 39–117)
Chloride: 103 mEq/L (ref 96–112)
GFR calc Af Amer: >60 mL/min (ref >60)
Potassium: 4.5 mEq/L (ref 3.5–5.1)
Sodium: 138 mEq/L (ref 135–145)
Total Protein: 6.3 g/dL (ref 6.0–8.3)

## 2010-09-24 LAB — CK: Total CK: 25 U/L (ref 7–232)

## 2010-09-24 LAB — DIFFERENTIAL
Basophils Relative: 0 % (ref 0–1)
Basophils Relative: 0 % (ref 0–1)
Eosinophils Absolute: 0.1 10*3/uL (ref 0.0–0.7)
Eosinophils Absolute: 0.4 10*3/uL (ref 0.0–0.7)
Eosinophils Relative: 6 % — ABNORMAL HIGH (ref 0–5)
Monocytes Absolute: 0.6 10*3/uL (ref 0.1–1.0)
Monocytes Relative: 7 % (ref 3–12)
Neutro Abs: 5.5 10*3/uL (ref 1.7–7.7)
Neutrophils Relative %: 79 % — ABNORMAL HIGH (ref 43–77)

## 2010-09-24 LAB — BASIC METABOLIC PANEL
CO2: 28 mEq/L (ref 19–32)
Calcium: 8.8 mg/dL (ref 8.4–10.5)
Creatinine, Ser: 1.14 mg/dL (ref 0.4–1.5)
GFR calc Af Amer: >60 mL/min (ref >60)
Glucose, Bld: 101 mg/dL — ABNORMAL HIGH (ref 70–99)

## 2010-09-24 LAB — CBC
MCHC: 33.9 g/dL (ref 30.0–36.0)
Platelets: 283 10*3/uL (ref 150–400)
Platelets: 346 10*3/uL (ref 150–400)
RDW: 13.3 % (ref 11.5–15.5)
RDW: 13.6 % (ref 11.5–15.5)
WBC: 7.6 10*3/uL (ref 4.0–10.5)

## 2010-09-24 LAB — ANAEROBIC CULTURE

## 2010-09-24 LAB — SEDIMENTATION RATE: Sed Rate: 70 mm/hr — ABNORMAL HIGH (ref 0–16)

## 2010-09-24 LAB — C-REACTIVE PROTEIN: CRP: 14.6 mg/dL — ABNORMAL HIGH (ref <0.6)

## 2010-10-12 ENCOUNTER — Encounter: Payer: Self-pay | Admitting: Infectious Diseases

## 2010-11-11 ENCOUNTER — Encounter: Payer: Self-pay | Admitting: Internal Medicine

## 2010-11-15 ENCOUNTER — Encounter: Payer: Self-pay | Admitting: Internal Medicine

## 2010-11-15 ENCOUNTER — Ambulatory Visit (INDEPENDENT_AMBULATORY_CARE_PROVIDER_SITE_OTHER): Payer: Medicare Other | Admitting: Internal Medicine

## 2010-11-15 VITALS — BP 120/82 | HR 63 | Ht 71.0 in | Wt 211.0 lb

## 2010-11-15 DIAGNOSIS — I491 Atrial premature depolarization: Secondary | ICD-10-CM

## 2010-11-15 NOTE — Progress Notes (Signed)
The patient presents today for routine electrophysiology followup.  Since last being seen in our clinic, the patient reports doing very well.  He remains active and continues to play tennis without difficulty.  Today, he denies symptoms of palpitations, chest pain, shortness of breath, orthopnea, PND, lower extremity edema, dizziness, presyncope, syncope, or neurologic sequela.  The patient feels that he is tolerating medications without difficulties and is otherwise without complaint today.   Past Medical History  Diagnosis Date  . GERD (gastroesophageal reflux disease)   . Hyperlipidemia   . BPH (benign prostatic hyperplasia)   . Back pain, chronic   . DJD (degenerative joint disease)   . PAC (premature atrial contraction)   . Hx of colonoscopy    Past Surgical History  Procedure Date  . Microdickectomy 11/1996  . Acdf 11/2009    C4-5, C5-C6, C6-7  . Redo decompression 07/30/2009  . Right knee arthroscopy     15 years ago    Current Outpatient Prescriptions  Medication Sig Dispense Refill  . aspirin 81 MG EC tablet Take 81 mg by mouth daily.        Marland Kitchen atorvastatin (LIPITOR) 20 MG tablet Take 20 mg by mouth daily.        Marland Kitchen darifenacin (ENABLEX) 7.5 MG 24 hr tablet Take 7.5 mg by mouth daily.        Marland Kitchen dutasteride (AVODART) 0.5 MG capsule Take 0.5 mg by mouth daily.        Marland Kitchen esomeprazole (NEXIUM) 40 MG capsule Take 40 mg by mouth daily before breakfast.        . fish oil-omega-3 fatty acids 1000 MG capsule Take 2 g by mouth daily.        . Multiple Vitamin (MULTIVITAMIN) capsule Take 1 capsule by mouth daily.          Allergies  Allergen Reactions  . Cefazolin   . Ceftin   . Vancomycin     History   Social History  . Marital Status: Married    Spouse Name: N/A    Number of Children: N/A  . Years of Education: N/A   Occupational History  . Not on file.   Social History Main Topics  . Smoking status: Never Smoker   . Smokeless tobacco: Not on file  . Alcohol Use: Yes      2-3 oz wine each evening, several glasses on weekends  . Drug Use: No  . Sexually Active: Not on file   Other Topics Concern  . Not on file   Social History Narrative   Lives in [place].Works as a Merchandiser, retail.     Family History  Problem Relation Age of Onset  . Heart attack Father     "enlarged heart" rheumatic fever   Physical Exam: Filed Vitals:   11/15/10 1106  BP: 120/82  Pulse: 63  Height: 5\' 11"  (1.803 m)  Weight: 211 lb (95.709 kg)    GEN- The patient is well appearing, alert and oriented x 3 today.   Head- normocephalic, atraumatic Eyes-  Sclera clear, conjunctiva pink Ears- hearing intact Oropharynx- clear Neck- supple, no JVP Lymph- no cervical lymphadenopathy Lungs- Clear to ausculation bilaterally, normal work of breathing Heart- Regular rate and rhythm with frequent ectopy, no murmurs, rubs or gallops, PMI not laterally displaced GI- soft, NT, ND, + BS Extremities- no clubbing, cyanosis, or edema MS- no significant deformity or atrophy Skin- no rash or lesion Psych- euthymic mood, full affect Neuro- strength and sensation are intact  ekg reveals sinus rhythm with frequent pacs, RsR', otherwise normal ekg  Assessment and Plan:

## 2010-11-15 NOTE — Patient Instructions (Signed)
Your physician wants you to follow-up in: 12 months with Dr Jacquiline Doe will receive a reminder letter in the mail two months in advance. If you don't receive a letter, please call our office to schedule the follow-up appointment.  Your physician has requested that you have an echocardiogram. Echocardiography is a painless test that uses sound waves to create images of your heart. It provides your doctor with information about the size and shape of your heart and how well your heart's chambers and valves are working. This procedure takes approximately one hour. There are no restrictions for this procedure. --See if we can do today

## 2010-11-15 NOTE — Assessment & Plan Note (Signed)
Stable and asymptomatic We will repeat echo. If echo ok, we will continue conservative management.  I would avoid AAD or ablation unless he becomes symptomatic.

## 2010-11-17 ENCOUNTER — Ambulatory Visit (HOSPITAL_COMMUNITY): Payer: Medicare Other | Attending: Internal Medicine | Admitting: Radiology

## 2010-11-17 DIAGNOSIS — E785 Hyperlipidemia, unspecified: Secondary | ICD-10-CM | POA: Insufficient documentation

## 2010-11-17 DIAGNOSIS — I491 Atrial premature depolarization: Secondary | ICD-10-CM | POA: Insufficient documentation

## 2010-11-17 DIAGNOSIS — I379 Nonrheumatic pulmonary valve disorder, unspecified: Secondary | ICD-10-CM | POA: Insufficient documentation

## 2010-11-17 DIAGNOSIS — I079 Rheumatic tricuspid valve disease, unspecified: Secondary | ICD-10-CM | POA: Insufficient documentation

## 2010-11-17 DIAGNOSIS — I080 Rheumatic disorders of both mitral and aortic valves: Secondary | ICD-10-CM | POA: Insufficient documentation

## 2010-11-19 NOTE — Op Note (Signed)
[place]. [facility]  Patient:    Timothy Aguirre                      MRN: [id number] Proc. Date: 11/04/99 Adm. Date:  [id number] Disc. Date: [id number] Attending:  Josie Saunders                           Operative Report  PREOPERATIVE DIAGNOSIS:  Cervical spondylosis and cervical stenosis with herniated disk and degenerative disk disease with myelopathy and radiculopathy at C4-5, C5-6 and C6-7 levels.  POSTOPERATIVE DIAGNOSIS:  Cervical spondylosis and cervical stenosis with herniated disk and degenerative disk disease with myelopathy and radiculopathy at C4-5, C5-6 and C6-7 levels.  OPERATION PERFORMED:  Anterior cervical diskectomy and fusion, C4-5, C5-6, C6-7 with allograft, bone graft and anterior cervical plate.  SURGEON:  Danae Orleans. Venetia Maxon, M.D.  ASSISTANT:  Cristi Loron, M.D.  ANESTHESIA:  General endotracheal.  ESTIMATED BLOOD LOSS:  Minimal.  COMPLICATIONS:  None.  DISPOSITION:  Recovery.  INDICATIONS FOR PROCEDURE:  Timothy Aguirre is a 77 year old man with an early cervical myelopathy with significant spinal cord compression most marked at C5-6 and to a lesser degree at the C4-5 and C6-7 levels.  It was elected to take him to surgery for anterior cervical diskectomy and fusion.  DESCRIPTION OF PROCEDURE:  The patient was brought to the operating room. Following satisfactory and uncomplicated induction of general endotracheal anesthesia and placement of intravenous lines and Foley catheter he was placed in supine position on the operating table.  The neck was placed into slight extension on the horseshoe headholder.  He was placed in 10 pounds of halter traction.  His anterior neck was then prepped and draped in the usual sterile fashion.  An incision was made along the anterior border of the sternocleidomastoid muscle from the C7 to C4 levels and carried sharply through platysma layer.  Subplatysmal dissection was performed  exposing the anterior cervical spine keeping the carotid sheath lateral, trachea and esophagus medial.  A spinal needle was placed through what was felt to be the C5-6 level and intraoperative x-ray confirmed this to be the C5-6 level.  The longus coli muscles were then taken down from the anterior cervical spine from C4 to C7 levels bilaterally and a self-retaining retractor was placed.  Large ventral osteophytes at C5-6 and smaller osteophyte at C4-5  were removed.  The disk space of C5-6, C4-5 and C6-7 levels were then incised with a 15 blade and disk material was removed in a piecemeal fashion with a variety of microcurets and pituitary rongeurs.  The microscope was then brought into the field. Initially at the C5-6 level the end plates were decorticated and significant osteophytes at ____________ were removed.  Uncinate spurs were drilled down with the Micromax drill.  The posterior longitudinal ligament was then incised and removed in a piecemeal fashion and the spinal cord and C6 nerve roots were decompressed bilaterally.  There was significant compression at the C5-6 level on the left compressing the spinal cord and C6 nerve root and these were decompressed.  An iliac crest allograft bone graft was fashioned to a depth of 13 mm and a thickness of 7 mm and was countersunk appropriately in the disk space.  Attention was then turned to the C4-5 level where similar decompression was performed.  Again there were large uncinate spurs bilaterally which were drilled down with Micromax drill and the  central canal was decompressed.  The posterior longitudinal ligament was removed and spinal cord dura was decompressed bilaterally with good foraminal decompression bilaterally.  A similarly sized iliac crest bone graft was placed and countersunk appropriately.  Attention was then turned to the C6-7 level where similar decompression was performed.  Again there were very large uncinate spurs  bilaterally at from C7 and these were drilled down.  The spinal cord dura was decompressed and the C7 nerve roots were decompressed.  A bone graft was then inserted and countersunk appropriately.  The patient was then taken out of halter traction.  A 67.5 mm Atlantis anterior cervical plate was then affixed to the anterior cervical spine with two screws at C4, two screws at C7 and one screw at C5, one screw at C6.  13 x 4 mm fixed angle screws were used. Locking mechanisms were engaged.  All screws had excellent purchase.  The wound was then copiously irrigated with bacitracin saline. There was no significant bleeding.  Final x-ray confirmed positioning of bone grafts in the anterior cervical plate.  The platysmal layer was then reapproximated with 0 Vicryl interrupted inverted sutures.  Skin edges were reapproximated with interrupted 3-0 Vicryl subcuticular stitch.  The wound was dressed with Dermabond and the patient was placed in an Aspen collar.  The patient appeared to tolerate the operation well.  He was taken to the recovery room in stable and satisfactory condition.  Counts were correct at the end of this case. DD:  11/04/99 TD:  11/07/99 Job: [id number]

## 2010-11-19 NOTE — Discharge Summary (Signed)
[place]. [facility]  Patient:    Timothy, Aguirre                      MRN: [id number] Adm. Date:  [id number] Disc. Date: [id number] Attending:  Josie Saunders                           Discharge Summary  REASON FOR ADMISSION:  Cervical disk herniation with cervical spondylosis and degenerative disk disease with cervical radiculopathy.  FINAL DIAGNOSIS:  Cervical disk herniation with cervical spondylosis and degenerative disk disease with cervical radiculopathy.  HISTORY OF PRESENT ILLNESS AND HOSPITAL COURSE:  Fabrice Dyal is a 77 year old man with herniated cervical disk, spondylosis, and myelopathy, with spinal stenosis, degenerative disk disease, and radiculopathy at C4-5, C5-6, and C6-7 levels.  It was elected to take him to surgery on same-day-as-procedure basis for anterior cervical diskectomy and fusion which he had performed at each of the affected levels.  Postoperatively, he did well.  Had minimal complaints.  Had full improvement in his radicular symptoms, and was discharged to home in stable and satisfactory condition, having tolerated the operation and hospitalization well.  DISCHARGE MEDICATIONS:  Hydrocodone or Percocet 1 or 2 every four hours as needed for pain, with Valium up to every six hours as needed for muscle spasm, along with regular medications.  Stool softener as needed.  FOLLOW-UP:  Return to Dr. Venetia Maxon for follow-up appointment in two weeks with lateral C-spine x-rays. DD:  12/10/99 TD:  12/14/99 Job: [id number]

## 2010-11-19 NOTE — H&P (Signed)
[name]. [facility]  Patient:    Timothy Aguirre, Timothy Aguirre                      MRN: [id number] Adm. Date:  [id number] Attending:  Josie Saunders                         History and Physical  CHIEF COMPLAINT: Cervical spondylitic myelopathy and radiculopathy, with herniated cervical disk and cervical spondylosis.  HISTORY OF PRESENT ILLNESS: Timothy Aguirre is a 77 year old right-handed man who was initially seen by Dr. Corlis Hove on September 29, 1999 for neurosurgical consultation because of neck and left arm pain associated with diffuse dysesthesias in his fingers.  The patient is an established patient of Dr. Elder Negus.  He had excision of multiple free fragments of herniated disk at L4-5 on the left in May 1998.  He was known to have associated spondylosis. He was working out at the gym at the [employer] in December 2000 and developed his current symptoms.  He is not having any right arm complaints.  There is no history of Lhermittes phenomenon.  He is not having any difficulty with his gait.  He has been taking three Aleve a day, and was wearing a soft collar.  General health has remained good.  His mother passed as well as at the age of 36 with multiple organ failure.  ALLERGIES: No known drug allergies.  REVIEW OF SYSTEMS: He has lost ten pounds in the last couple of months on a diet and exercise regimen.  His general health has remained good.  PHYSICAL EXAMINATION:  GENERAL: On initial examination the patient is noted to be a well-developed, well-nourished man.  HEENT: Negative.  NECK: See below.  LUNGS: Clear.  HEART: Sinus rhythm with no murmurs.  ABDOMEN: Soft, nontender.  EXTREMITIES: See below.  There are no signs of atrophy or vesiculation.  He has no evidence of ischemic changes or thrombophlebitis.  There are good pulses in both feet.  NEUROLOGIC: The patients mental status is normal.  His pupils are equal, round,  and reactive to light.  Extraocular movements are full.  Discs are flat.  There is no facial paresis.  Vision and hearing are normal.  The patients gait is normal.  He has only minimal difficulty with tandem walking. He does balance slightly better on his right leg than with the left off the ground and then reverse.  Strength is normal in the upper and lower extremities with the exception of left triceps weakness.  No significant hypalgesia to pinprick.  Joint position sense is normal.  Deep tendon reflexes are mildly brisk throughout, with plantar Babinski responses.  No ankle clonus.  He has limited neck extension and neck flexion and tilting his neck to the left causes tingling and dysesthesias in his left arm.  He has no cervical paravertebral spasm.  No supraclavicular or infraclavicular abnormalities.  The patient was subsequently sent for an MRI of his cervical spine, and he returned to see Dr. Roxan Hockey on October 01, 1999.  This showed significant spinal stenosis at C4-5, C5-6, and C6-7 levels, actually worse at the C5-6 level where he has central and left-sided disk protrusion causing significant cord compression.  Dr. Roxan Hockey did not see any cord signal change and no significant instability but felt that he needed surgical intervention due to the degree of canal stenosis.  I saw the patient on October 05, 1999  and went over his radiographs and MRI.  He had been previously seen by Dr. Roxan Hockey and was felt to be a candidate for anterior cervical diskectomy and fusion at C4-5, C5-6, and C6-7 levels.  He has fairly early cervical myelopathy with left arm numbness and with extension of his neck significant spinal cord compression at the C5-6 greater than C4-5 and C6-7 levels.  I concurred with Dr. Elder Negus recommendation of anterior cervical diskectomy and fusion at the effected levels of C4-5, C5-6, and C6-7 for cervical spinal stenosis and herniated disk as well as degenerative disk  disease.  The patients examination was quite good on October 05, 1999.  He did not have significant focal weakness.  He did not have hyperreflexia in his lower extremities; however, I was concerned regarding the degree of cervical spinal cord compression and significant degenerative disease and osteophyte formation at each of these levels.  I therefore recommended to the patient that he undergo anterior cervical diskectomy with fusion and plating.  I went over the diagnostic studies in detail with him and reviewed the surgical models, and also discussed the exact nature of the surgical procedure, attendant risks and potential benefits, and typical operative and postoperative course.  I discussed the risks of surgery which include, but are not limited to the risks of anesthesia with blood loss, infection, injury to various neck structures including trachea/esophagus which could cause either temporary or permanent swallowing difficulties, and also potential for perforation of the esophagus which might require operative intervention, the larynx or recurrent laryngeal nerve roots which could cause either temporary or permanent vocal cord paralysis resulting in either temporary or permanent voice changes, injury to cervical nerve roots which could cause either temporary or permanent arm pain, numbness and/or weakness.  There is a small chance of injury to the spinal cord which could cause paralysis.  There is also the potential for malplacement of instrumentation, fusion failure and need for repeat surgery, degenerative disease at other levels in his neck, failure to relieve pain, or worsening of pain.  I also discussed with the patient that he would lose some neck mobility with surgery.  It is typical to stay in the hospital overnight after this operation no acute distress typically he will not be able to drive for two weeks after surgery.  He will come back to see me two weeks following surgery,  with lateral cervical spine x-ray to be done, and then for monthly visits for three months after surgery.  Generally patients are out of work for  four to six weeks after surgery.  He will wear a soft collar for at least two weeks after surgery.  PLAN: Mr. Harton is already scheduled for a trip to [place] and wanted to go ahead with that.  I did not see any reason he could not go.  There is slightly increased risk of spinal cord injury if he fell or had an accident but that risk was not so high that he should not travel.  He wished to go ahead with surgery and this was set up for Nov 04, 1999. DD:  11/04/99 TD:  11/04/99 Job: [id number]

## 2010-11-22 ENCOUNTER — Encounter: Payer: Self-pay | Admitting: Internal Medicine

## 2010-11-22 ENCOUNTER — Telehealth: Payer: Self-pay | Admitting: *Deleted

## 2010-11-22 NOTE — Telephone Encounter (Signed)
Left message of machine with normal echo results

## 2010-11-22 NOTE — Telephone Encounter (Signed)
Pt in meeting and will call me back to get his Echo results

## 2010-11-22 NOTE — Progress Notes (Signed)
Pt to call back in meeting

## 2010-11-22 NOTE — Telephone Encounter (Signed)
Pt calling back re echo results-pls call [phone] next 1hour and a half or can leave a message

## 2010-12-09 ENCOUNTER — Other Ambulatory Visit: Payer: Self-pay | Admitting: Dermatology

## 2011-06-13 IMAGING — CR DG LUMBAR SPINE 1V
1 series · 1 of 1 positions shown · non-contrast
Comparison: MRI 06/24/2009

CLINICAL DATA: L3-4, L4-5 decompression.

LUMBAR SPINE - 1 VIEW

[view not recorded]
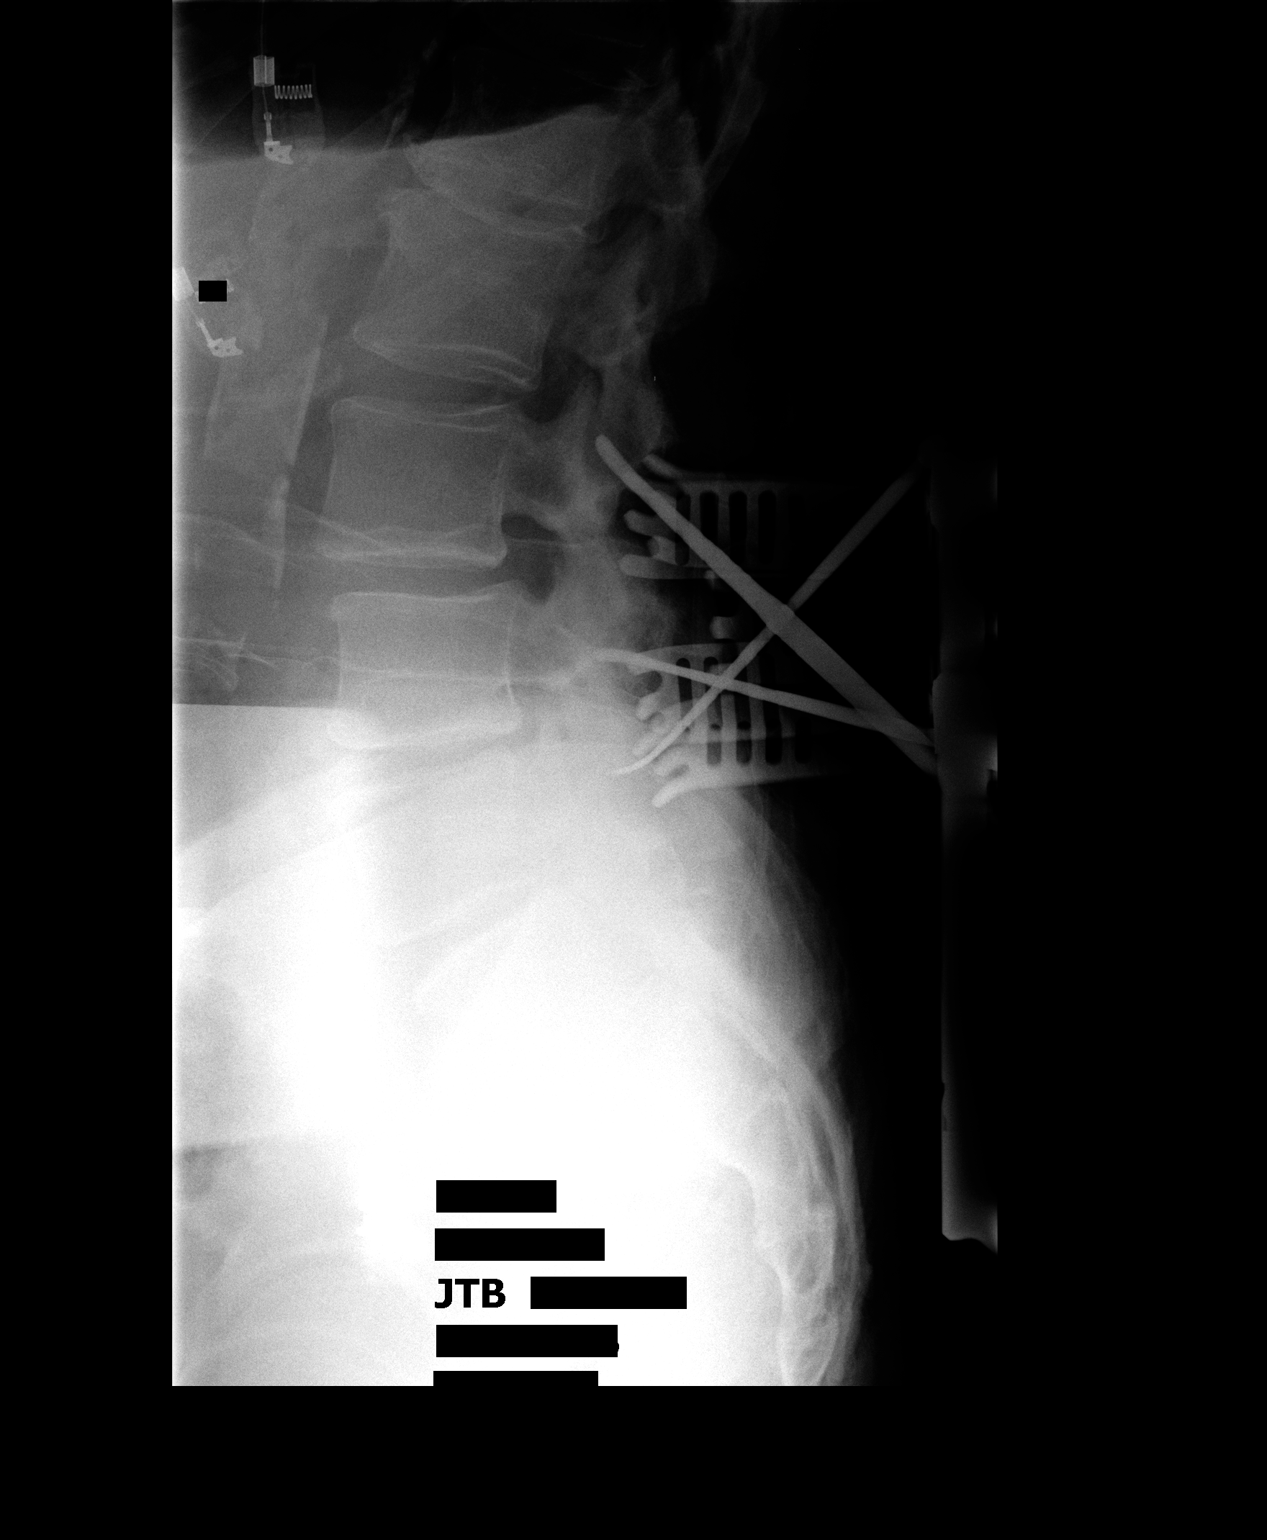

[1 of 1 positions shown; findings below may reference images not displayed]

FINDINGS: Single lateral intraoperative image demonstrates
posterior surgical instruments at the level of the pedicles at L3,
L4 and L5.
IMPRESSION: Intraoperative localization as above.

## 2011-06-13 IMAGING — RF DG LUMBAR SPINE 2-3V
1 series · 2 of 2 positions shown · non-contrast
Comparison: 07/30/2009

CLINICAL DATA: L3-L5 decompression.

LUMBAR SPINE - 2-3 VIEW

[Series 1: run · 2 of 2 slices shown]
[im 1/2]
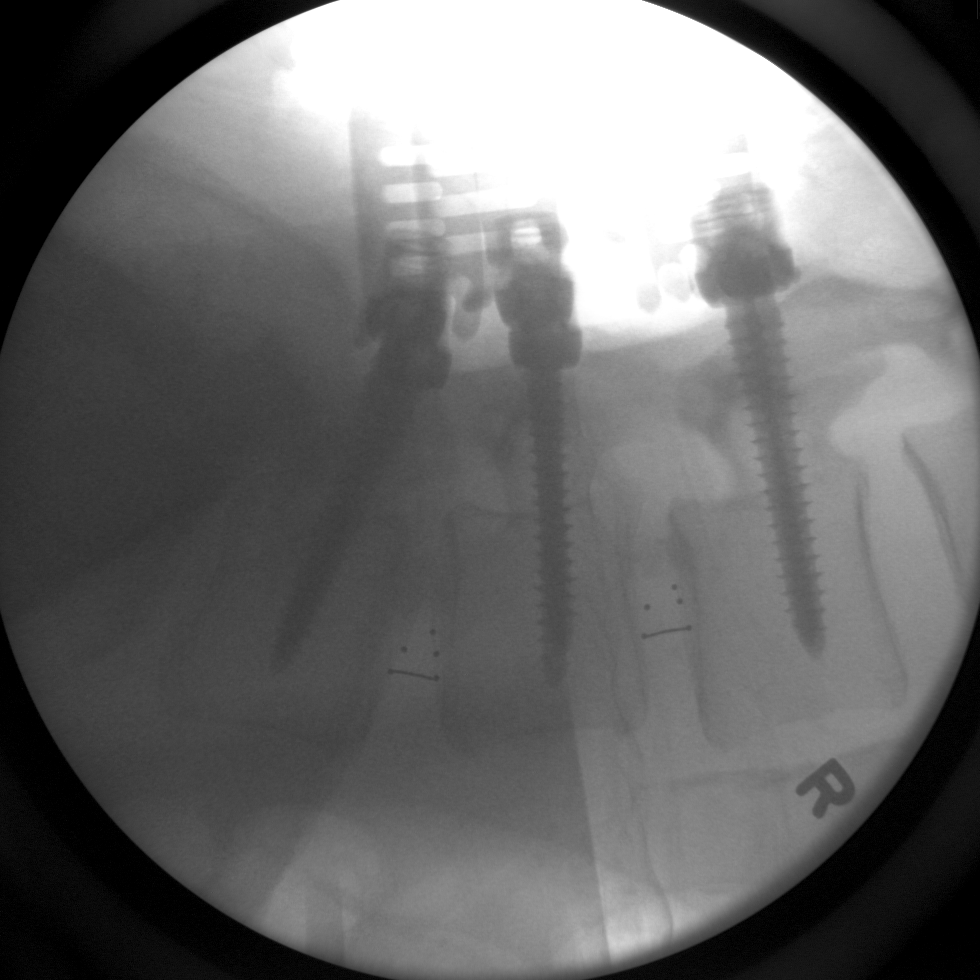
[im 2/2]
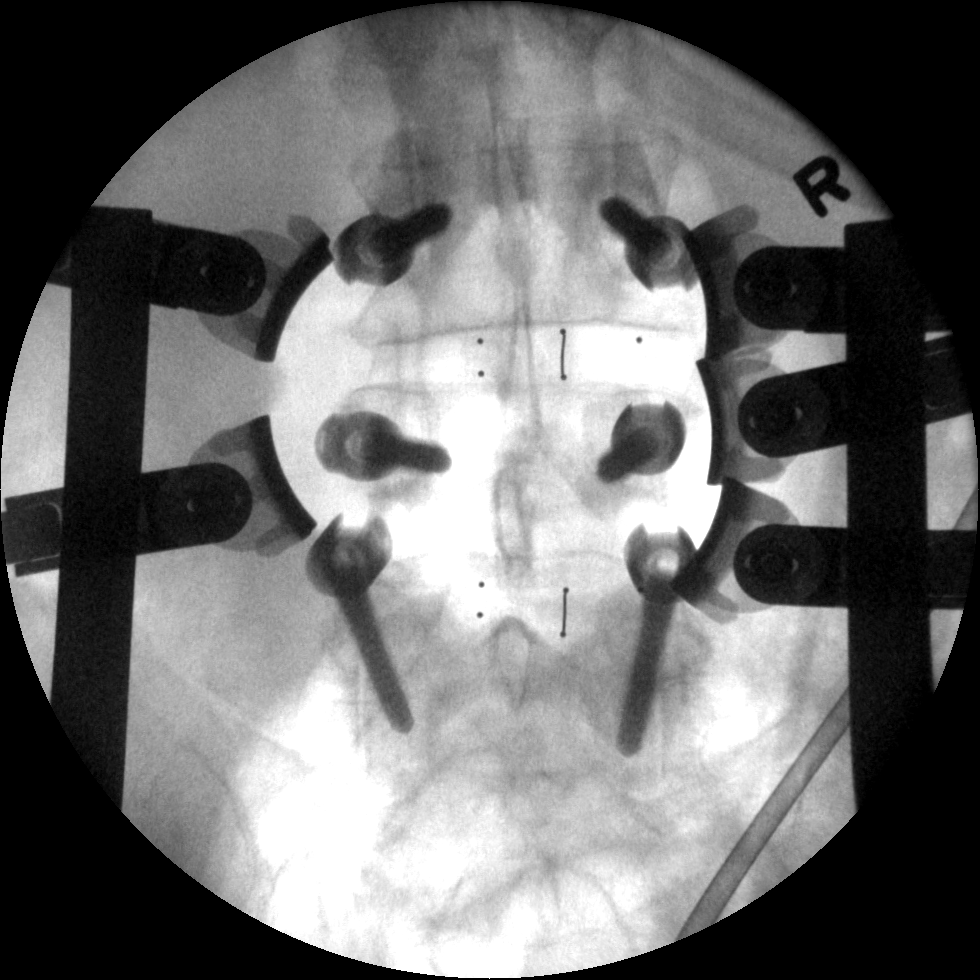

[2 of 2 positions shown; findings below may reference images not displayed]

FINDINGS: Two intraoperative spot images demonstrate placement of
posterior pedicle screws from L3-L5.  Normal alignment.  Discectomy
changes noted.
IMPRESSION: Pedicle screws L3-L5.

## 2011-08-01 DIAGNOSIS — K449 Diaphragmatic hernia without obstruction or gangrene: Secondary | ICD-10-CM | POA: Diagnosis not present

## 2011-08-01 DIAGNOSIS — R7301 Impaired fasting glucose: Secondary | ICD-10-CM | POA: Diagnosis not present

## 2011-08-01 DIAGNOSIS — E785 Hyperlipidemia, unspecified: Secondary | ICD-10-CM | POA: Diagnosis not present

## 2011-08-01 DIAGNOSIS — R413 Other amnesia: Secondary | ICD-10-CM | POA: Diagnosis not present

## 2011-08-10 DIAGNOSIS — H04129 Dry eye syndrome of unspecified lacrimal gland: Secondary | ICD-10-CM | POA: Diagnosis not present

## 2011-08-10 DIAGNOSIS — H04123 Dry eye syndrome of bilateral lacrimal glands: Secondary | ICD-10-CM | POA: Insufficient documentation

## 2011-08-17 DIAGNOSIS — N401 Enlarged prostate with lower urinary tract symptoms: Secondary | ICD-10-CM | POA: Diagnosis not present

## 2011-08-17 DIAGNOSIS — N400 Enlarged prostate without lower urinary tract symptoms: Secondary | ICD-10-CM | POA: Diagnosis not present

## 2011-08-17 DIAGNOSIS — N138 Other obstructive and reflux uropathy: Secondary | ICD-10-CM | POA: Diagnosis not present

## 2011-08-17 DIAGNOSIS — N318 Other neuromuscular dysfunction of bladder: Secondary | ICD-10-CM | POA: Diagnosis not present

## 2011-08-19 DIAGNOSIS — N138 Other obstructive and reflux uropathy: Secondary | ICD-10-CM | POA: Diagnosis not present

## 2011-08-19 DIAGNOSIS — N401 Enlarged prostate with lower urinary tract symptoms: Secondary | ICD-10-CM | POA: Diagnosis not present

## 2011-08-19 DIAGNOSIS — N318 Other neuromuscular dysfunction of bladder: Secondary | ICD-10-CM | POA: Diagnosis not present

## 2011-08-30 ENCOUNTER — Other Ambulatory Visit: Payer: Self-pay | Admitting: Gastroenterology

## 2011-08-30 NOTE — Telephone Encounter (Signed)
Called left message for pt to call back, I can not find in any system where pt has had Nexium filled. Who has been filling Nexium? And Patient will need an office visit to have any meds filled by Dr Jarold Motto.

## 2011-08-30 NOTE — Telephone Encounter (Signed)
Left message to call back  

## 2011-08-31 NOTE — Telephone Encounter (Signed)
Left message for pt that he will need office visit to have any rxs filled with Dr Jarold Motto.

## 2011-11-21 ENCOUNTER — Encounter: Payer: Self-pay | Admitting: Internal Medicine

## 2011-11-21 ENCOUNTER — Ambulatory Visit (INDEPENDENT_AMBULATORY_CARE_PROVIDER_SITE_OTHER): Payer: Medicare Other | Admitting: Internal Medicine

## 2011-11-21 VITALS — BP 128/72 | HR 72 | Ht 71.75 in | Wt 210.0 lb

## 2011-11-21 DIAGNOSIS — I491 Atrial premature depolarization: Secondary | ICD-10-CM

## 2011-11-21 NOTE — Patient Instructions (Signed)
Your physician wants you to follow-up in: 12 months with Dr Allred You will receive a reminder letter in the mail two months in advance. If you don't receive a letter, please call our office to schedule the follow-up appointment.  

## 2011-11-21 NOTE — Progress Notes (Signed)
PCP: Gwen Pounds, MD, MD  Timothy Aguirre is a 78 y.o. male who presents today for routine electrophysiology followup.  Since last being seen in our clinic, the patient reports doing very well.  He remains active and continues to play tennis/ walk on a treadmill without difficulty.  Today, he denies symptoms of palpitations, chest pain, shortness of breath,  lower extremity edema, dizziness, presyncope, or syncope.  The patient is otherwise without complaint today.   Past Medical History  Diagnosis Date  . GERD (gastroesophageal reflux disease)   . Hyperlipidemia   . BPH (benign prostatic hyperplasia)   . Back pain, chronic   . DJD (degenerative joint disease)   . PAC (premature atrial contraction)   . Hx of colonoscopy    Past Surgical History  Procedure Date  . Microdickectomy 11/1996  . Acdf 11/2009    C4-5, C5-C6, C6-7  . Redo decompression 07/30/2009  . Right knee arthroscopy     15 years ago    Current Outpatient Prescriptions  Medication Sig Dispense Refill  . aspirin 81 MG EC tablet Take 81 mg by mouth daily.        Marland Kitchen atorvastatin (LIPITOR) 20 MG tablet Take 20 mg by mouth daily.        Marland Kitchen darifenacin (ENABLEX) 7.5 MG 24 hr tablet Take 7.5 mg by mouth daily.        Marland Kitchen dutasteride (AVODART) 0.5 MG capsule Take 0.5 mg by mouth daily.        Marland Kitchen esomeprazole (NEXIUM) 40 MG capsule Take 40 mg by mouth daily before breakfast.        . finasteride (PROSCAR) 5 MG tablet Take 5 mg by mouth daily.       . fish oil-omega-3 fatty acids 1000 MG capsule Take 2 g by mouth daily.        . Multiple Vitamin (MULTIVITAMIN) capsule Take 1 capsule by mouth daily.        Marland Kitchen oxybutynin (DITROPAN-XL) 10 MG 24 hr tablet Take 10 mg by mouth daily.       . pantoprazole (PROTONIX) 40 MG tablet Take 40 mg by mouth daily.         Physical Exam: Filed Vitals:   11/21/11 1103  BP: 128/72  Pulse: 72  Height: 5' 11.75" (1.822 m)  Weight: 210 lb (95.255 kg)    GEN- The patient is well appearing,  alert and oriented x 3 today.   Head- normocephalic, atraumatic Eyes-  Sclera clear, conjunctiva pink Ears- hearing intact Oropharynx- clear Lungs- Clear to ausculation bilaterally, normal work of breathing Heart- Regular rate and rhythm, no murmurs, rubs or gallops, PMI not laterally displaced GI- soft, NT, ND, + BS Extremities- no clubbing, cyanosis, or edema  ekg today reveals sinus rhythm 72 bpm, PR 178, incomplete RBBB Echo from 2012 reviewed  Assessment and Plan:

## 2011-11-21 NOTE — Assessment & Plan Note (Signed)
He has a h/o PACs.  He has never had documented afib. His PACs now appear much better and he is asymptomatic.  No changes today Return in 1 year.

## 2012-01-03 ENCOUNTER — Other Ambulatory Visit: Payer: Self-pay | Admitting: Dermatology

## 2012-01-03 DIAGNOSIS — Z85820 Personal history of malignant melanoma of skin: Secondary | ICD-10-CM | POA: Diagnosis not present

## 2012-01-03 DIAGNOSIS — L820 Inflamed seborrheic keratosis: Secondary | ICD-10-CM | POA: Diagnosis not present

## 2012-01-03 DIAGNOSIS — L570 Actinic keratosis: Secondary | ICD-10-CM | POA: Diagnosis not present

## 2012-01-03 DIAGNOSIS — Z85828 Personal history of other malignant neoplasm of skin: Secondary | ICD-10-CM | POA: Diagnosis not present

## 2012-02-02 DIAGNOSIS — Z79899 Other long term (current) drug therapy: Secondary | ICD-10-CM | POA: Diagnosis not present

## 2012-02-02 DIAGNOSIS — R7301 Impaired fasting glucose: Secondary | ICD-10-CM | POA: Diagnosis not present

## 2012-02-02 DIAGNOSIS — E785 Hyperlipidemia, unspecified: Secondary | ICD-10-CM | POA: Diagnosis not present

## 2012-02-02 DIAGNOSIS — Z125 Encounter for screening for malignant neoplasm of prostate: Secondary | ICD-10-CM | POA: Diagnosis not present

## 2012-02-07 DIAGNOSIS — K219 Gastro-esophageal reflux disease without esophagitis: Secondary | ICD-10-CM | POA: Diagnosis not present

## 2012-02-07 DIAGNOSIS — M1990 Unspecified osteoarthritis, unspecified site: Secondary | ICD-10-CM | POA: Diagnosis not present

## 2012-02-07 DIAGNOSIS — G4733 Obstructive sleep apnea (adult) (pediatric): Secondary | ICD-10-CM | POA: Diagnosis not present

## 2012-02-07 DIAGNOSIS — Z0000 Encounter for general adult medical examination without abnormal findings: Secondary | ICD-10-CM | POA: Diagnosis not present

## 2012-02-09 DIAGNOSIS — Z1212 Encounter for screening for malignant neoplasm of rectum: Secondary | ICD-10-CM | POA: Diagnosis not present

## 2012-02-23 DIAGNOSIS — M5430 Sciatica, unspecified side: Secondary | ICD-10-CM | POA: Diagnosis not present

## 2012-02-23 DIAGNOSIS — M1710 Unilateral primary osteoarthritis, unspecified knee: Secondary | ICD-10-CM | POA: Diagnosis not present

## 2012-02-23 DIAGNOSIS — IMO0002 Reserved for concepts with insufficient information to code with codable children: Secondary | ICD-10-CM | POA: Diagnosis not present

## 2012-06-19 DIAGNOSIS — Z23 Encounter for immunization: Secondary | ICD-10-CM | POA: Diagnosis not present

## 2012-07-10 ENCOUNTER — Other Ambulatory Visit: Payer: Self-pay | Admitting: Dermatology

## 2012-07-10 DIAGNOSIS — L821 Other seborrheic keratosis: Secondary | ICD-10-CM | POA: Diagnosis not present

## 2012-07-10 DIAGNOSIS — L2089 Other atopic dermatitis: Secondary | ICD-10-CM | POA: Diagnosis not present

## 2012-07-10 DIAGNOSIS — L910 Hypertrophic scar: Secondary | ICD-10-CM | POA: Diagnosis not present

## 2012-07-10 DIAGNOSIS — L819 Disorder of pigmentation, unspecified: Secondary | ICD-10-CM | POA: Diagnosis not present

## 2012-07-10 DIAGNOSIS — Z85828 Personal history of other malignant neoplasm of skin: Secondary | ICD-10-CM | POA: Diagnosis not present

## 2012-07-10 DIAGNOSIS — D239 Other benign neoplasm of skin, unspecified: Secondary | ICD-10-CM | POA: Diagnosis not present

## 2012-07-10 DIAGNOSIS — D485 Neoplasm of uncertain behavior of skin: Secondary | ICD-10-CM | POA: Diagnosis not present

## 2012-07-10 DIAGNOSIS — Z85820 Personal history of malignant melanoma of skin: Secondary | ICD-10-CM | POA: Diagnosis not present

## 2012-07-24 ENCOUNTER — Other Ambulatory Visit: Payer: Self-pay | Admitting: Dermatology

## 2012-07-24 DIAGNOSIS — Z85828 Personal history of other malignant neoplasm of skin: Secondary | ICD-10-CM | POA: Diagnosis not present

## 2012-07-24 DIAGNOSIS — Z85820 Personal history of malignant melanoma of skin: Secondary | ICD-10-CM | POA: Diagnosis not present

## 2012-07-24 DIAGNOSIS — D485 Neoplasm of uncertain behavior of skin: Secondary | ICD-10-CM | POA: Diagnosis not present

## 2012-08-10 DIAGNOSIS — IMO0002 Reserved for concepts with insufficient information to code with codable children: Secondary | ICD-10-CM | POA: Diagnosis not present

## 2012-08-10 DIAGNOSIS — M1710 Unilateral primary osteoarthritis, unspecified knee: Secondary | ICD-10-CM | POA: Diagnosis not present

## 2012-08-10 DIAGNOSIS — M25519 Pain in unspecified shoulder: Secondary | ICD-10-CM | POA: Diagnosis not present

## 2012-08-14 DIAGNOSIS — N401 Enlarged prostate with lower urinary tract symptoms: Secondary | ICD-10-CM | POA: Diagnosis not present

## 2012-08-14 DIAGNOSIS — E785 Hyperlipidemia, unspecified: Secondary | ICD-10-CM | POA: Diagnosis not present

## 2012-08-14 DIAGNOSIS — R7301 Impaired fasting glucose: Secondary | ICD-10-CM | POA: Diagnosis not present

## 2012-08-14 DIAGNOSIS — R413 Other amnesia: Secondary | ICD-10-CM | POA: Diagnosis not present

## 2012-08-14 DIAGNOSIS — N138 Other obstructive and reflux uropathy: Secondary | ICD-10-CM | POA: Diagnosis not present

## 2012-08-14 DIAGNOSIS — Z1331 Encounter for screening for depression: Secondary | ICD-10-CM | POA: Diagnosis not present

## 2012-09-07 DIAGNOSIS — IMO0002 Reserved for concepts with insufficient information to code with codable children: Secondary | ICD-10-CM | POA: Diagnosis not present

## 2012-09-07 DIAGNOSIS — M1710 Unilateral primary osteoarthritis, unspecified knee: Secondary | ICD-10-CM | POA: Diagnosis not present

## 2012-09-29 DIAGNOSIS — R197 Diarrhea, unspecified: Secondary | ICD-10-CM | POA: Diagnosis not present

## 2012-09-29 DIAGNOSIS — R112 Nausea with vomiting, unspecified: Secondary | ICD-10-CM | POA: Diagnosis not present

## 2012-09-29 DIAGNOSIS — K5289 Other specified noninfective gastroenteritis and colitis: Secondary | ICD-10-CM | POA: Diagnosis not present

## 2012-10-24 DIAGNOSIS — IMO0002 Reserved for concepts with insufficient information to code with codable children: Secondary | ICD-10-CM | POA: Diagnosis not present

## 2012-11-21 ENCOUNTER — Encounter: Payer: Self-pay | Admitting: Internal Medicine

## 2012-11-21 ENCOUNTER — Ambulatory Visit (INDEPENDENT_AMBULATORY_CARE_PROVIDER_SITE_OTHER): Payer: Medicare Other | Admitting: Internal Medicine

## 2012-11-21 VITALS — BP 120/69 | HR 78 | Ht 72.0 in | Wt 198.0 lb

## 2012-11-21 DIAGNOSIS — I491 Atrial premature depolarization: Secondary | ICD-10-CM | POA: Diagnosis not present

## 2012-11-21 NOTE — Patient Instructions (Addendum)
Your physician wants you to follow-up in: 12 months with Dr Allred You will receive a reminder letter in the mail two months in advance. If you don't receive a letter, please call our office to schedule the follow-up appointment.  

## 2012-11-21 NOTE — Progress Notes (Signed)
PCP: Gwen Pounds, MD  Timothy Aguirre is a 79 y.o. male who presents today for routine electrophysiology followup.  Since last being seen in our clinic, the patient reports doing very well.  He remains active and continues to play tennis/ walk on a treadmill without difficulty.  He denies symptoms of PACs.  Today, he denies symptoms of palpitations, chest pain, shortness of breath,  lower extremity edema, dizziness, presyncope, or syncope.  The patient is otherwise without complaint today.   Past Medical History  Diagnosis Date  . GERD (gastroesophageal reflux disease)   . Hyperlipidemia   . BPH (benign prostatic hyperplasia)   . Back pain, chronic   . DJD (degenerative joint disease)   . PAC (premature atrial contraction)   . Hx of colonoscopy    Past Surgical History  Procedure Laterality Date  . Microdickectomy  11/1996  . Acdf  11/2009    C4-5, C5-C6, C6-7  . Redo decompression  07/30/2009  . Right knee arthroscopy      15 years ago    Current Outpatient Prescriptions  Medication Sig Dispense Refill  . aspirin 81 MG EC tablet Take 81 mg by mouth daily.        Marland Kitchen atorvastatin (LIPITOR) 20 MG tablet Take 20 mg by mouth daily.        Marland Kitchen esomeprazole (NEXIUM) 40 MG capsule Take 40 mg by mouth daily before breakfast.        . finasteride (PROSCAR) 5 MG tablet Take 5 mg by mouth daily.       . fish oil-omega-3 fatty acids 1000 MG capsule Take 2 g by mouth daily.        . Multiple Vitamin (MULTIVITAMIN) capsule Take 1 capsule by mouth daily.        Marland Kitchen NAPROXEN PO Take by mouth as needed.      Marland Kitchen oxybutynin (DITROPAN-XL) 10 MG 24 hr tablet Take 10 mg by mouth daily.       . pantoprazole (PROTONIX) 40 MG tablet Take 40 mg by mouth daily.        No current facility-administered medications for this visit.    Physical Exam: Filed Vitals:   11/21/12 1024  BP: 120/69  Pulse: 78  Height: 6' (1.829 m)  Weight: 198 lb (89.812 kg)    GEN- The patient is well appearing, alert and  oriented x 3 today.   Head- normocephalic, atraumatic Eyes-  Sclera clear, conjunctiva pink Ears- hearing intact Oropharynx- clear Lungs- Clear to ausculation bilaterally, normal work of breathing Heart- Regular rate and rhythm, no murmurs, rubs or gallops, PMI not laterally displaced GI- soft, NT, ND, + BS Extremities- no clubbing, cyanosis, or edema  ekg today reveals sinus rhythm 85 bpm, PR 168, incomplete RBBB  Assessment and Plan:  1. PACs Well controlled No further workup  2. HL Stable No change required today   Return in 1 year

## 2012-12-28 DIAGNOSIS — K219 Gastro-esophageal reflux disease without esophagitis: Secondary | ICD-10-CM | POA: Diagnosis not present

## 2012-12-28 DIAGNOSIS — E7800 Pure hypercholesterolemia, unspecified: Secondary | ICD-10-CM | POA: Diagnosis not present

## 2012-12-28 DIAGNOSIS — R197 Diarrhea, unspecified: Secondary | ICD-10-CM | POA: Diagnosis not present

## 2012-12-28 DIAGNOSIS — R109 Unspecified abdominal pain: Secondary | ICD-10-CM | POA: Diagnosis not present

## 2012-12-28 DIAGNOSIS — R112 Nausea with vomiting, unspecified: Secondary | ICD-10-CM | POA: Diagnosis not present

## 2013-01-02 DIAGNOSIS — H2510 Age-related nuclear cataract, unspecified eye: Secondary | ICD-10-CM | POA: Diagnosis not present

## 2013-01-02 DIAGNOSIS — H04129 Dry eye syndrome of unspecified lacrimal gland: Secondary | ICD-10-CM | POA: Diagnosis not present

## 2013-01-28 DIAGNOSIS — Z85828 Personal history of other malignant neoplasm of skin: Secondary | ICD-10-CM | POA: Diagnosis not present

## 2013-01-28 DIAGNOSIS — L909 Atrophic disorder of skin, unspecified: Secondary | ICD-10-CM | POA: Diagnosis not present

## 2013-01-28 DIAGNOSIS — D2360 Other benign neoplasm of skin of unspecified upper limb, including shoulder: Secondary | ICD-10-CM | POA: Diagnosis not present

## 2013-01-28 DIAGNOSIS — D2370 Other benign neoplasm of skin of unspecified lower limb, including hip: Secondary | ICD-10-CM | POA: Diagnosis not present

## 2013-01-28 DIAGNOSIS — L738 Other specified follicular disorders: Secondary | ICD-10-CM | POA: Diagnosis not present

## 2013-01-28 DIAGNOSIS — L919 Hypertrophic disorder of the skin, unspecified: Secondary | ICD-10-CM | POA: Diagnosis not present

## 2013-01-28 DIAGNOSIS — L821 Other seborrheic keratosis: Secondary | ICD-10-CM | POA: Diagnosis not present

## 2013-01-28 DIAGNOSIS — Z85820 Personal history of malignant melanoma of skin: Secondary | ICD-10-CM | POA: Diagnosis not present

## 2013-01-28 DIAGNOSIS — D239 Other benign neoplasm of skin, unspecified: Secondary | ICD-10-CM | POA: Diagnosis not present

## 2013-02-12 DIAGNOSIS — R7301 Impaired fasting glucose: Secondary | ICD-10-CM | POA: Diagnosis not present

## 2013-02-12 DIAGNOSIS — Z125 Encounter for screening for malignant neoplasm of prostate: Secondary | ICD-10-CM | POA: Diagnosis not present

## 2013-02-12 DIAGNOSIS — E785 Hyperlipidemia, unspecified: Secondary | ICD-10-CM | POA: Diagnosis not present

## 2013-02-19 DIAGNOSIS — R413 Other amnesia: Secondary | ICD-10-CM | POA: Diagnosis not present

## 2013-02-19 DIAGNOSIS — R7301 Impaired fasting glucose: Secondary | ICD-10-CM | POA: Diagnosis not present

## 2013-02-19 DIAGNOSIS — Z0000 Encounter for general adult medical examination without abnormal findings: Secondary | ICD-10-CM | POA: Diagnosis not present

## 2013-02-19 DIAGNOSIS — R159 Full incontinence of feces: Secondary | ICD-10-CM | POA: Diagnosis not present

## 2013-02-19 DIAGNOSIS — K219 Gastro-esophageal reflux disease without esophagitis: Secondary | ICD-10-CM | POA: Diagnosis not present

## 2013-02-19 DIAGNOSIS — K449 Diaphragmatic hernia without obstruction or gangrene: Secondary | ICD-10-CM | POA: Diagnosis not present

## 2013-02-19 DIAGNOSIS — M5137 Other intervertebral disc degeneration, lumbosacral region: Secondary | ICD-10-CM | POA: Diagnosis not present

## 2013-02-19 DIAGNOSIS — R011 Cardiac murmur, unspecified: Secondary | ICD-10-CM | POA: Diagnosis not present

## 2013-03-15 DIAGNOSIS — Z1212 Encounter for screening for malignant neoplasm of rectum: Secondary | ICD-10-CM | POA: Diagnosis not present

## 2013-04-01 DIAGNOSIS — Z23 Encounter for immunization: Secondary | ICD-10-CM | POA: Diagnosis not present

## 2013-04-12 DIAGNOSIS — IMO0002 Reserved for concepts with insufficient information to code with codable children: Secondary | ICD-10-CM | POA: Diagnosis not present

## 2013-07-24 DIAGNOSIS — N401 Enlarged prostate with lower urinary tract symptoms: Secondary | ICD-10-CM | POA: Diagnosis not present

## 2013-07-24 DIAGNOSIS — N318 Other neuromuscular dysfunction of bladder: Secondary | ICD-10-CM | POA: Diagnosis not present

## 2013-07-24 DIAGNOSIS — N138 Other obstructive and reflux uropathy: Secondary | ICD-10-CM | POA: Diagnosis not present

## 2013-07-24 DIAGNOSIS — N529 Male erectile dysfunction, unspecified: Secondary | ICD-10-CM | POA: Diagnosis not present

## 2013-08-06 DIAGNOSIS — Z85828 Personal history of other malignant neoplasm of skin: Secondary | ICD-10-CM | POA: Diagnosis not present

## 2013-08-06 DIAGNOSIS — Z85820 Personal history of malignant melanoma of skin: Secondary | ICD-10-CM | POA: Diagnosis not present

## 2013-08-06 DIAGNOSIS — L259 Unspecified contact dermatitis, unspecified cause: Secondary | ICD-10-CM | POA: Diagnosis not present

## 2013-08-06 DIAGNOSIS — L570 Actinic keratosis: Secondary | ICD-10-CM | POA: Diagnosis not present

## 2013-08-06 DIAGNOSIS — L821 Other seborrheic keratosis: Secondary | ICD-10-CM | POA: Diagnosis not present

## 2013-08-06 DIAGNOSIS — D239 Other benign neoplasm of skin, unspecified: Secondary | ICD-10-CM | POA: Diagnosis not present

## 2013-08-06 DIAGNOSIS — L819 Disorder of pigmentation, unspecified: Secondary | ICD-10-CM | POA: Diagnosis not present

## 2013-08-20 DIAGNOSIS — G4733 Obstructive sleep apnea (adult) (pediatric): Secondary | ICD-10-CM | POA: Diagnosis not present

## 2013-08-20 DIAGNOSIS — F959 Tic disorder, unspecified: Secondary | ICD-10-CM | POA: Diagnosis not present

## 2013-08-20 DIAGNOSIS — R413 Other amnesia: Secondary | ICD-10-CM | POA: Diagnosis not present

## 2013-08-20 DIAGNOSIS — R7301 Impaired fasting glucose: Secondary | ICD-10-CM | POA: Diagnosis not present

## 2013-08-20 DIAGNOSIS — E785 Hyperlipidemia, unspecified: Secondary | ICD-10-CM | POA: Diagnosis not present

## 2013-08-20 DIAGNOSIS — G4700 Insomnia, unspecified: Secondary | ICD-10-CM | POA: Diagnosis not present

## 2013-08-20 DIAGNOSIS — G250 Essential tremor: Secondary | ICD-10-CM | POA: Diagnosis not present

## 2013-08-20 DIAGNOSIS — G252 Other specified forms of tremor: Secondary | ICD-10-CM | POA: Diagnosis not present

## 2013-08-20 DIAGNOSIS — R159 Full incontinence of feces: Secondary | ICD-10-CM | POA: Diagnosis not present

## 2013-09-26 DIAGNOSIS — E785 Hyperlipidemia, unspecified: Secondary | ICD-10-CM | POA: Diagnosis not present

## 2013-10-10 DIAGNOSIS — H2510 Age-related nuclear cataract, unspecified eye: Secondary | ICD-10-CM | POA: Diagnosis not present

## 2013-10-10 DIAGNOSIS — H01009 Unspecified blepharitis unspecified eye, unspecified eyelid: Secondary | ICD-10-CM | POA: Diagnosis not present

## 2013-10-10 DIAGNOSIS — G245 Blepharospasm: Secondary | ICD-10-CM | POA: Diagnosis not present

## 2013-11-19 DIAGNOSIS — G245 Blepharospasm: Secondary | ICD-10-CM | POA: Diagnosis not present

## 2013-11-27 ENCOUNTER — Ambulatory Visit: Payer: Medicare Other | Admitting: Internal Medicine

## 2013-12-09 ENCOUNTER — Ambulatory Visit (INDEPENDENT_AMBULATORY_CARE_PROVIDER_SITE_OTHER): Payer: Medicare Other | Admitting: Internal Medicine

## 2013-12-09 ENCOUNTER — Encounter: Payer: Self-pay | Admitting: Internal Medicine

## 2013-12-09 VITALS — BP 132/82 | HR 68 | Ht 71.25 in | Wt 211.0 lb

## 2013-12-09 DIAGNOSIS — I491 Atrial premature depolarization: Secondary | ICD-10-CM

## 2013-12-09 NOTE — Progress Notes (Signed)
PCP: Precious Reel, MD  Timothy Aguirre is a 80 y.o. male who presents today for routine electrophysiology followup.  Since last being seen in our clinic, the patient reports doing very well.  He remains active and continues to play tennis/ walk on a treadmill without difficulty.  He denies symptoms of PACs.  Today, he denies symptoms of palpitations, chest pain, shortness of breath,  lower extremity edema, dizziness, presyncope, or syncope.  The patient is otherwise without complaint today.   Past Medical History  Diagnosis Date  . GERD (gastroesophageal reflux disease)   . Hyperlipidemia   . BPH (benign prostatic hyperplasia)   . Back pain, chronic   . DJD (degenerative joint disease)   . PAC (premature atrial contraction)   . Hx of colonoscopy    Past Surgical History  Procedure Laterality Date  . Microdickectomy  11/1996  . Acdf  11/2009    C4-5, C5-C6, C6-7  . Redo decompression  07/30/2009  . Right knee arthroscopy      15 years ago    Current Outpatient Prescriptions  Medication Sig Dispense Refill  . aspirin 81 MG EC tablet Take 81 mg by mouth daily.        Marland Kitchen atorvastatin (LIPITOR) 20 MG tablet Take 20 mg by mouth daily.        Marland Kitchen DOXYLAMINE SUCCINATE, SLEEP, PO Take 1 tablet by mouth at bedtime.      . finasteride (PROSCAR) 5 MG tablet Take 5 mg by mouth daily.       . fish oil-omega-3 fatty acids 1000 MG capsule Take 2 g by mouth daily.        . Multiple Vitamin (MULTIVITAMIN) capsule Take 1 capsule by mouth daily.        Marland Kitchen NAPROXEN PO Take 1 tablet by mouth as needed.       . naproxen sodium (RA NAPROXEN SODIUM) 220 MG tablet Take 1 tablet by mouth at bedtime.      Marland Kitchen oxybutynin (DITROPAN-XL) 10 MG 24 hr tablet Take 10 mg by mouth daily.       . pantoprazole (PROTONIX) 40 MG tablet Take 40 mg by mouth daily.       . temazepam (RESTORIL) 30 MG capsule Take 1 capsule by mouth as needed.       No current facility-administered medications for this visit.    Physical  Exam: Filed Vitals:   12/09/13 1632  BP: 132/82  Pulse: 68  Height: 5' 11.25" (1.81 m)  Weight: 211 lb (95.709 kg)    GEN- The patient is well appearing, alert and oriented x 3 today.   Head- normocephalic, atraumatic Eyes-  Sclera clear, conjunctiva pink Ears- hearing intact Oropharynx- clear Lungs- Clear to ausculation bilaterally, normal work of breathing Heart- Regular rate and rhythm, no murmurs, rubs or gallops, PMI not laterally displaced GI- soft, NT, ND, + BS Extremities- no clubbing, cyanosis, or edema  ekg today reveals sinus rhythm 68 bpm, PR 180, incomplete RBBB, PVCs  Assessment and Plan:  1. PACs Well controlled No further workup  2. HL Stable No change required today   Return in 2 years

## 2013-12-09 NOTE — Patient Instructions (Signed)
Your physician recommends that you continue on your current medications as directed. Please refer to the Current Medication list given to you today.  Your physician wants you to follow-up in: 2 years with Dr. Rayann Heman.  You will receive a reminder letter in the mail two months in advance. If you don't receive a letter, please call our office to schedule the follow-up appointment.

## 2014-02-08 ENCOUNTER — Encounter (HOSPITAL_COMMUNITY): Payer: Self-pay | Admitting: Emergency Medicine

## 2014-02-08 ENCOUNTER — Emergency Department (HOSPITAL_COMMUNITY)
Admission: EM | Admit: 2014-02-08 | Discharge: 2014-02-08 | Disposition: A | Discharge status: Home / Self Care | Payer: Medicare Other | Attending: Emergency Medicine | Admitting: Emergency Medicine

## 2014-02-08 ENCOUNTER — Emergency Department (HOSPITAL_COMMUNITY): Payer: Medicare Other

## 2014-02-08 DIAGNOSIS — S46909A Unspecified injury of unspecified muscle, fascia and tendon at shoulder and upper arm level, unspecified arm, initial encounter: Secondary | ICD-10-CM | POA: Insufficient documentation

## 2014-02-08 DIAGNOSIS — S43016A Anterior dislocation of unspecified humerus, initial encounter: Secondary | ICD-10-CM | POA: Diagnosis not present

## 2014-02-08 DIAGNOSIS — Z791 Long term (current) use of non-steroidal anti-inflammatories (NSAID): Secondary | ICD-10-CM | POA: Diagnosis not present

## 2014-02-08 DIAGNOSIS — N400 Enlarged prostate without lower urinary tract symptoms: Secondary | ICD-10-CM | POA: Insufficient documentation

## 2014-02-08 DIAGNOSIS — Z7982 Long term (current) use of aspirin: Secondary | ICD-10-CM | POA: Diagnosis not present

## 2014-02-08 DIAGNOSIS — IMO0002 Reserved for concepts with insufficient information to code with codable children: Secondary | ICD-10-CM | POA: Diagnosis not present

## 2014-02-08 DIAGNOSIS — Z8739 Personal history of other diseases of the musculoskeletal system and connective tissue: Secondary | ICD-10-CM | POA: Insufficient documentation

## 2014-02-08 DIAGNOSIS — S4980XA Other specified injuries of shoulder and upper arm, unspecified arm, initial encounter: Secondary | ICD-10-CM | POA: Diagnosis present

## 2014-02-08 DIAGNOSIS — S43006A Unspecified dislocation of unspecified shoulder joint, initial encounter: Secondary | ICD-10-CM | POA: Diagnosis not present

## 2014-02-08 DIAGNOSIS — E785 Hyperlipidemia, unspecified: Secondary | ICD-10-CM | POA: Insufficient documentation

## 2014-02-08 DIAGNOSIS — K219 Gastro-esophageal reflux disease without esophagitis: Secondary | ICD-10-CM | POA: Insufficient documentation

## 2014-02-08 DIAGNOSIS — Z8679 Personal history of other diseases of the circulatory system: Secondary | ICD-10-CM | POA: Diagnosis not present

## 2014-02-08 DIAGNOSIS — R296 Repeated falls: Secondary | ICD-10-CM | POA: Diagnosis not present

## 2014-02-08 DIAGNOSIS — S43036A Inferior dislocation of unspecified humerus, initial encounter: Secondary | ICD-10-CM | POA: Diagnosis not present

## 2014-02-08 DIAGNOSIS — S43014A Anterior dislocation of right humerus, initial encounter: Secondary | ICD-10-CM

## 2014-02-08 DIAGNOSIS — Y9289 Other specified places as the place of occurrence of the external cause: Secondary | ICD-10-CM | POA: Insufficient documentation

## 2014-02-08 DIAGNOSIS — Z79899 Other long term (current) drug therapy: Secondary | ICD-10-CM | POA: Insufficient documentation

## 2014-02-08 DIAGNOSIS — G8929 Other chronic pain: Secondary | ICD-10-CM | POA: Insufficient documentation

## 2014-02-08 DIAGNOSIS — Y9373 Activity, racquet and hand sports: Secondary | ICD-10-CM | POA: Insufficient documentation

## 2014-02-08 MED ORDER — FENTANYL CITRATE 0.05 MG/ML IJ SOLN
100.0000 ug | Freq: Once | INTRAMUSCULAR | Status: AC
  Administered 2014-02-08: 100 ug via INTRAVENOUS
  Filled 2014-02-08: qty 2

## 2014-02-08 MED ORDER — DIAZEPAM 5 MG/ML IJ SOLN
5.0000 mg | Freq: Once | INTRAMUSCULAR | Status: AC
  Administered 2014-02-08: 5 mg via INTRAVENOUS
  Filled 2014-02-08: qty 2

## 2014-02-08 NOTE — ED Notes (Signed)
Patient reports that he fell playing tennis and the patient has a noted deformity to his right shoulder . Multiple abraisions

## 2014-02-08 NOTE — Discharge Instructions (Signed)
Shoulder Dislocation  Your shoulder is made up of three bones: the collar bone (clavicle); the shoulder blade (scapula), which includes the socket (glenoid cavity); and the upper arm bone (humerus). Your shoulder joint is the place where these bones meet. Strong, fibrous tissues hold these bones together (ligaments). Muscles and strong, fibrous tissues that connect the muscles to these bones (tendons) allow your arm to move through this joint. The range of motion of your shoulder joint is more extensive than most of your other joints, and the glenoid cavity is very shallow. That is the reason that your shoulder joint is one of the most unstable joints in your body. It is far more prone to dislocation than your other joints. Shoulder dislocation is when your humerus is forced out of your shoulder joint.  CAUSES  Shoulder dislocation is caused by a forceful impact on your shoulder. This impact usually is from an injury, such as a sports injury or a fall.  SYMPTOMS  Symptoms of shoulder dislocation include:  · Deformity of your shoulder.  · Intense pain.  · Inability to move your shoulder joint.  · Numbness, weakness, or tingling around your shoulder joint (your neck or down your arm).  · Bruising or swelling around your shoulder.  DIAGNOSIS  In order to diagnose a dislocated shoulder, your caregiver will perform a physical exam. Your caregiver also may have an X-ray exam done to see if you have any broken bones. Magnetic resonance imaging (MRI) is a procedure that sometimes is done to help your caregiver see any damage to the soft tissues around your shoulder, particularly your rotator cuff tendons. Additionally, your caregiver also may have electromyography done to measure the electrical discharges produced in your muscles if you have signs or symptoms of nerve damage.  TREATMENT  A shoulder dislocation is treated by placing the humerus back in the joint (reduction). Your caregiver does this either manually (closed  reduction), by moving your humerus back into the joint through manipulation, or through surgery (open reduction). When your humerus is back in place, severe pain should improve almost immediately.  You also may need to have surgery if you have a weak shoulder joint or ligaments, and you have recurring shoulder dislocations, despite rehabilitation. In rare cases, surgery is necessary if your nerves or blood vessels are damaged during the dislocation.  After your reduction, your arm will be placed in a shoulder immobilizer or sling to keep it from moving. Your caregiver will have you wear your shoulder immobilizer or sling for 3 days to 3 weeks, depending on how serious your dislocation is. When your shoulder immobilizer or sling is removed, your caregiver may prescribe physical therapy to help improve the range of motion in your shoulder joint.  HOME CARE INSTRUCTIONS   The following measures can help to reduce pain and speed up the healing process:  · Rest your injured joint. Do not move it. Avoid activities similar to the one that caused your injury.  · Apply ice to your injured joint for the first day or two after your reduction or as directed by your caregiver. Applying ice helps to reduce inflammation and pain.  ¨ Put ice in a plastic bag.  ¨ Place a towel between your skin and the bag.  ¨ Leave the ice on for 15-20 minutes at a time, every 2 hours while you are awake.  · Exercise your hand by squeezing a soft ball. This helps to eliminate stiffness and swelling in your hand and   wrist.  · Take over-the-counter or prescription medicine for pain or discomfort as told by your caregiver.  SEEK IMMEDIATE MEDICAL CARE IF:   · Your shoulder immobilizer or sling becomes damaged.  · Your pain becomes worse rather than better.  · You lose feeling in your arm or hand, or they become white and cold.  MAKE SURE YOU:   · Understand these instructions.  · Will watch your condition.  · Will get help right away if you are not  doing well or get worse.  Document Released: 03/15/2001 Document Revised: 11/04/2013 Document Reviewed: 04/10/2011  [employer]® Patient Information ©2015 [employer]. This information is not intended to replace advice given to you by your health care provider. Make sure you discuss any questions you have with your health care provider.

## 2014-02-08 NOTE — ED Provider Notes (Signed)
CSN: [id number]     Arrival date & time 02/08/14  1051 History   First MD Initiated Contact with Patient 02/08/14 1108     Chief Complaint  Patient presents with  . Shoulder Injury    r/shoulder pain, fall  . Abrasion    multiple abrasions  . Fall     (Consider location/radiation/quality/duration/timing/severity/associated sxs/prior Treatment) HPI  80yM with R shoulder pain. [place] just prior to arrival while playing tennis. Pain/deformity since. Did strike head. No LOC. Denies any significant HA, neck or back pain. Abrasions to face and b/l knees but denies much pain aside from shoulder. No blood thinners aside from 81 mg ASA. No intervention prior to arrival.   Past Medical History  Diagnosis Date  . GERD (gastroesophageal reflux disease)   . Hyperlipidemia   . BPH (benign prostatic hyperplasia)   . Back pain, chronic   . DJD (degenerative joint disease)   . PAC (premature atrial contraction)   . Hx of colonoscopy    Past Surgical History  Procedure Laterality Date  . Microdickectomy  11/1996  . Acdf  11/2009    C4-5, C5-C6, C6-7  . Redo decompression  07/30/2009  . Right knee arthroscopy      15 years ago   Family History  Problem Relation Age of Onset  . Heart attack Father     "enlarged heart" rheumatic fever   History  Substance Use Topics  . Smoking status: Never Smoker   . Smokeless tobacco: Not on file  . Alcohol Use: Yes     Comment: 2-3 oz wine each evening, several glasses on weekends    Review of Systems  All systems reviewed and negative, other than as noted in HPI.   Allergies  Cefazolin; Cefuroxime axetil; and Vancomycin  Home Medications   Prior to Admission medications   Medication Sig Start Date End Date Taking? Authorizing Provider  aspirin 81 MG EC tablet Take 81 mg by mouth daily.     Yes Historical Provider, MD  atorvastatin (LIPITOR) 20 MG tablet Take 20 mg by mouth daily.     Yes Historical Provider, MD  DOXYLAMINE SUCCINATE,  SLEEP, PO Take 1 tablet by mouth at bedtime.   Yes Historical Provider, MD  finasteride (PROSCAR) 5 MG tablet Take 5 mg by mouth daily.  08/17/11  Yes Historical Provider, MD  fish oil-omega-3 fatty acids 1000 MG capsule Take 2 g by mouth daily.     Yes Historical Provider, MD  Multiple Vitamin (MULTIVITAMIN) capsule Take 1 capsule by mouth daily.     Yes Historical Provider, MD  naproxen sodium (ANAPROX) 220 MG tablet Take 220 mg by mouth 2 (two) times daily with a meal.   Yes Historical Provider, MD  oxybutynin (DITROPAN-XL) 10 MG 24 hr tablet Take 10 mg by mouth daily.  08/17/11  Yes Historical Provider, MD  pantoprazole (PROTONIX) 40 MG tablet Take 40 mg by mouth daily.  09/06/11  Yes Historical Provider, MD   BP 124/60  Pulse 57  Temp(Src) 97.9 F (36.6 C) (Oral)  Resp 15  SpO2 95% Physical Exam  Nursing note and vitals reviewed. Constitutional: He appears well-developed and well-nourished. No distress.  HENT:  Head: Normocephalic and atraumatic.  Eyes: Conjunctivae are normal. Right eye exhibits no discharge. Left eye exhibits no discharge.  Neck: Neck supple.  Cardiovascular: Normal rate, regular rhythm and normal heart sounds.  Exam reveals no gallop and no friction rub.   No murmur heard. Pulmonary/Chest: Effort normal and breath  sounds normal. No respiratory distress.  Abdominal: Soft. He exhibits no distension. There is no tenderness.  Musculoskeletal:  Right shoulder deformity consistent with anterior dislocation. Significant pain with any attempted range of motion. Closed injury. Grip strength is good and his right hand. Sensation is intact to light touch. He appears well perfused. Strong radial pulse. No midline spinal tenderness  Neurological: He is alert.  Skin: Skin is warm and dry.  Superficial abrasions to bilateral knees and right face. No significant underlying bony tenderness. There is no significant pain with range of motion of either knee.  Psychiatric: He has a  normal mood and affect. His behavior is normal. Thought content normal.    ED Course  Reduction of dislocation Date/Time: 02/08/2014 12:30 PM Performed by: Virgel Manifold Authorized by: Virgel Manifold Consent: Verbal consent obtained. Risks and benefits: risks, benefits and alternatives were discussed Consent given by: patient Patient identity confirmed: provided demographic data and verbally with patient Local anesthesia used: yes Anesthesia: local infiltration Local anesthetic: lidocaine 1% without epinephrine Anesthetic total: 5 ml Patient sedated: yes Sedation type: anxiolysis Analgesia: see MAR for details Vitals: Vital signs were monitored during sedation. Patient tolerance: Patient tolerated the procedure well with no immediate complications. Comments: Given IV fentanyl/valium. Lateral subacromial intraarticular injection of 1% lidocaine. Reduced with gentle external rotation/milch.    (including critical care time) Labs Review Labs Reviewed - No data to display  Imaging Review Dg Shoulder Right  02/08/2014   CLINICAL DATA:  80 year old male with right shoulder injury and pain.  EXAM: RIGHT SHOULDER - 2+ VIEW  COMPARISON:  None.  FINDINGS: Anterior inferior right humeral head dislocation is identified.  No definite fracture is noted.  The visualized right bony thorax is unremarkable otherwise.  IMPRESSION: Anterior inferior dislocation of the right humeral head. No definite fracture.   Electronically Signed   By: Hassan Rowan M.D.   On: 02/08/2014 11:53   Dg Shoulder Right Port  02/08/2014   CLINICAL DATA:  Right shoulder dislocation-postreduction.  EXAM: PORTABLE RIGHT SHOULDER - 2+ VIEW  COMPARISON:  Film earlier this day  FINDINGS: The right humeral head is now located.  There is no evidence of fracture, subluxation or dislocation.  No other bony abnormalities are present.  IMPRESSION: Relocation of the right humeral head.   Electronically Signed   By: Hassan Rowan M.D.   On: 02/08/2014  13:06     EKG Interpretation None      MDM   Final diagnoses:  Anterior dislocation of right shoulder, initial encounter    80yM with anterior R shoulder dislocation. Closed injury. Reduced. Pt with weakness R deltoid after reduction concerning for axillary nerve injury. NVI otherwise. Sling. Close ortho FU.     Virgel Manifold, MD 02/13/14 [phone]

## 2014-02-11 DIAGNOSIS — G245 Blepharospasm: Secondary | ICD-10-CM | POA: Diagnosis not present

## 2014-02-12 DIAGNOSIS — S40029A Contusion of unspecified upper arm, initial encounter: Secondary | ICD-10-CM | POA: Diagnosis not present

## 2014-02-12 DIAGNOSIS — S40019A Contusion of unspecified shoulder, initial encounter: Secondary | ICD-10-CM | POA: Diagnosis not present

## 2014-02-12 DIAGNOSIS — R21 Rash and other nonspecific skin eruption: Secondary | ICD-10-CM | POA: Diagnosis not present

## 2014-02-21 DIAGNOSIS — L738 Other specified follicular disorders: Secondary | ICD-10-CM | POA: Diagnosis not present

## 2014-02-21 DIAGNOSIS — Z85828 Personal history of other malignant neoplasm of skin: Secondary | ICD-10-CM | POA: Diagnosis not present

## 2014-02-21 DIAGNOSIS — Z85820 Personal history of malignant melanoma of skin: Secondary | ICD-10-CM | POA: Diagnosis not present

## 2014-02-21 DIAGNOSIS — L678 Other hair color and hair shaft abnormalities: Secondary | ICD-10-CM | POA: Diagnosis not present

## 2014-02-24 DIAGNOSIS — E785 Hyperlipidemia, unspecified: Secondary | ICD-10-CM | POA: Diagnosis not present

## 2014-02-24 DIAGNOSIS — R7301 Impaired fasting glucose: Secondary | ICD-10-CM | POA: Diagnosis not present

## 2014-02-24 DIAGNOSIS — Z125 Encounter for screening for malignant neoplasm of prostate: Secondary | ICD-10-CM | POA: Diagnosis not present

## 2014-02-24 DIAGNOSIS — Z79899 Other long term (current) drug therapy: Secondary | ICD-10-CM | POA: Diagnosis not present

## 2014-02-25 DIAGNOSIS — N401 Enlarged prostate with lower urinary tract symptoms: Secondary | ICD-10-CM | POA: Diagnosis not present

## 2014-02-25 DIAGNOSIS — E785 Hyperlipidemia, unspecified: Secondary | ICD-10-CM | POA: Diagnosis not present

## 2014-02-25 DIAGNOSIS — Z1331 Encounter for screening for depression: Secondary | ICD-10-CM | POA: Diagnosis not present

## 2014-02-25 DIAGNOSIS — R21 Rash and other nonspecific skin eruption: Secondary | ICD-10-CM | POA: Diagnosis not present

## 2014-02-25 DIAGNOSIS — M79609 Pain in unspecified limb: Secondary | ICD-10-CM | POA: Diagnosis not present

## 2014-02-25 DIAGNOSIS — N138 Other obstructive and reflux uropathy: Secondary | ICD-10-CM | POA: Diagnosis not present

## 2014-02-25 DIAGNOSIS — Z0000 Encounter for general adult medical examination without abnormal findings: Secondary | ICD-10-CM | POA: Diagnosis not present

## 2014-02-25 DIAGNOSIS — F959 Tic disorder, unspecified: Secondary | ICD-10-CM | POA: Diagnosis not present

## 2014-02-25 DIAGNOSIS — S43006A Unspecified dislocation of unspecified shoulder joint, initial encounter: Secondary | ICD-10-CM | POA: Diagnosis not present

## 2014-02-25 DIAGNOSIS — R413 Other amnesia: Secondary | ICD-10-CM | POA: Diagnosis not present

## 2014-02-28 DIAGNOSIS — Z1212 Encounter for screening for malignant neoplasm of rectum: Secondary | ICD-10-CM | POA: Diagnosis not present

## 2014-03-04 DIAGNOSIS — M25519 Pain in unspecified shoulder: Secondary | ICD-10-CM | POA: Diagnosis not present

## 2014-03-04 DIAGNOSIS — S43016A Anterior dislocation of unspecified humerus, initial encounter: Secondary | ICD-10-CM | POA: Diagnosis not present

## 2014-03-05 DIAGNOSIS — M25519 Pain in unspecified shoulder: Secondary | ICD-10-CM | POA: Diagnosis not present

## 2014-03-07 DIAGNOSIS — Z5189 Encounter for other specified aftercare: Secondary | ICD-10-CM | POA: Diagnosis not present

## 2014-03-11 DIAGNOSIS — S43499A Other sprain of unspecified shoulder joint, initial encounter: Secondary | ICD-10-CM | POA: Diagnosis not present

## 2014-03-11 DIAGNOSIS — S4430XA Injury of axillary nerve, unspecified arm, initial encounter: Secondary | ICD-10-CM | POA: Diagnosis not present

## 2014-03-11 DIAGNOSIS — G8918 Other acute postprocedural pain: Secondary | ICD-10-CM | POA: Diagnosis not present

## 2014-03-11 DIAGNOSIS — S43429A Sprain of unspecified rotator cuff capsule, initial encounter: Secondary | ICD-10-CM | POA: Diagnosis not present

## 2014-03-11 DIAGNOSIS — X58XXXA Exposure to other specified factors, initial encounter: Secondary | ICD-10-CM | POA: Diagnosis not present

## 2014-03-11 DIAGNOSIS — M7500 Adhesive capsulitis of unspecified shoulder: Secondary | ICD-10-CM | POA: Diagnosis not present

## 2014-03-11 DIAGNOSIS — Y929 Unspecified place or not applicable: Secondary | ICD-10-CM | POA: Diagnosis not present

## 2014-03-11 DIAGNOSIS — M25819 Other specified joint disorders, unspecified shoulder: Secondary | ICD-10-CM | POA: Diagnosis not present

## 2014-03-11 DIAGNOSIS — M19019 Primary osteoarthritis, unspecified shoulder: Secondary | ICD-10-CM | POA: Diagnosis not present

## 2014-03-14 DIAGNOSIS — F29 Unspecified psychosis not due to a substance or known physiological condition: Secondary | ICD-10-CM | POA: Diagnosis not present

## 2014-03-14 DIAGNOSIS — R5383 Other fatigue: Secondary | ICD-10-CM | POA: Diagnosis not present

## 2014-03-14 DIAGNOSIS — E785 Hyperlipidemia, unspecified: Secondary | ICD-10-CM | POA: Diagnosis not present

## 2014-03-14 DIAGNOSIS — R5381 Other malaise: Secondary | ICD-10-CM | POA: Diagnosis not present

## 2014-03-14 DIAGNOSIS — S43006A Unspecified dislocation of unspecified shoulder joint, initial encounter: Secondary | ICD-10-CM | POA: Diagnosis not present

## 2014-03-21 DIAGNOSIS — Z4789 Encounter for other orthopedic aftercare: Secondary | ICD-10-CM | POA: Diagnosis not present

## 2014-03-21 DIAGNOSIS — M25519 Pain in unspecified shoulder: Secondary | ICD-10-CM | POA: Diagnosis not present

## 2014-03-24 DIAGNOSIS — M25519 Pain in unspecified shoulder: Secondary | ICD-10-CM | POA: Diagnosis not present

## 2014-03-27 DIAGNOSIS — M25519 Pain in unspecified shoulder: Secondary | ICD-10-CM | POA: Diagnosis not present

## 2014-03-31 DIAGNOSIS — H2510 Age-related nuclear cataract, unspecified eye: Secondary | ICD-10-CM | POA: Diagnosis not present

## 2014-03-31 DIAGNOSIS — M25519 Pain in unspecified shoulder: Secondary | ICD-10-CM | POA: Diagnosis not present

## 2014-04-07 DIAGNOSIS — M25511 Pain in right shoulder: Secondary | ICD-10-CM | POA: Diagnosis not present

## 2014-04-09 DIAGNOSIS — M25511 Pain in right shoulder: Secondary | ICD-10-CM | POA: Diagnosis not present

## 2014-04-14 DIAGNOSIS — M25511 Pain in right shoulder: Secondary | ICD-10-CM | POA: Diagnosis not present

## 2014-04-17 DIAGNOSIS — M25511 Pain in right shoulder: Secondary | ICD-10-CM | POA: Diagnosis not present

## 2014-04-22 DIAGNOSIS — M25511 Pain in right shoulder: Secondary | ICD-10-CM | POA: Diagnosis not present

## 2014-04-25 DIAGNOSIS — M25511 Pain in right shoulder: Secondary | ICD-10-CM | POA: Diagnosis not present

## 2014-04-29 DIAGNOSIS — Z23 Encounter for immunization: Secondary | ICD-10-CM | POA: Diagnosis not present

## 2014-04-29 DIAGNOSIS — M25511 Pain in right shoulder: Secondary | ICD-10-CM | POA: Diagnosis not present

## 2014-05-01 DIAGNOSIS — M25511 Pain in right shoulder: Secondary | ICD-10-CM | POA: Diagnosis not present

## 2014-05-13 DIAGNOSIS — M25511 Pain in right shoulder: Secondary | ICD-10-CM | POA: Diagnosis not present

## 2014-05-16 DIAGNOSIS — M25511 Pain in right shoulder: Secondary | ICD-10-CM | POA: Diagnosis not present

## 2014-05-19 DIAGNOSIS — M25511 Pain in right shoulder: Secondary | ICD-10-CM | POA: Diagnosis not present

## 2014-05-23 DIAGNOSIS — M25511 Pain in right shoulder: Secondary | ICD-10-CM | POA: Diagnosis not present

## 2014-05-26 DIAGNOSIS — M25511 Pain in right shoulder: Secondary | ICD-10-CM | POA: Diagnosis not present

## 2014-06-02 DIAGNOSIS — M25511 Pain in right shoulder: Secondary | ICD-10-CM | POA: Diagnosis not present

## 2014-06-06 DIAGNOSIS — M25511 Pain in right shoulder: Secondary | ICD-10-CM | POA: Diagnosis not present

## 2014-06-10 DIAGNOSIS — H2523 Age-related cataract, morgagnian type, bilateral: Secondary | ICD-10-CM | POA: Diagnosis not present

## 2014-06-10 DIAGNOSIS — G245 Blepharospasm: Secondary | ICD-10-CM | POA: Diagnosis not present

## 2014-06-11 DIAGNOSIS — M25511 Pain in right shoulder: Secondary | ICD-10-CM | POA: Diagnosis not present

## 2014-06-13 DIAGNOSIS — M25511 Pain in right shoulder: Secondary | ICD-10-CM | POA: Diagnosis not present

## 2014-06-16 DIAGNOSIS — M25511 Pain in right shoulder: Secondary | ICD-10-CM | POA: Diagnosis not present

## 2014-06-20 DIAGNOSIS — M25511 Pain in right shoulder: Secondary | ICD-10-CM | POA: Diagnosis not present

## 2014-07-11 DIAGNOSIS — M25511 Pain in right shoulder: Secondary | ICD-10-CM | POA: Diagnosis not present

## 2014-07-16 DIAGNOSIS — M25511 Pain in right shoulder: Secondary | ICD-10-CM | POA: Diagnosis not present

## 2014-07-18 DIAGNOSIS — M25511 Pain in right shoulder: Secondary | ICD-10-CM | POA: Diagnosis not present

## 2014-07-24 DIAGNOSIS — M25511 Pain in right shoulder: Secondary | ICD-10-CM | POA: Diagnosis not present

## 2014-07-28 DIAGNOSIS — Z4789 Encounter for other orthopedic aftercare: Secondary | ICD-10-CM | POA: Diagnosis not present

## 2014-07-28 DIAGNOSIS — S43014D Anterior dislocation of right humerus, subsequent encounter: Secondary | ICD-10-CM | POA: Diagnosis not present

## 2014-07-29 DIAGNOSIS — N5201 Erectile dysfunction due to arterial insufficiency: Secondary | ICD-10-CM | POA: Diagnosis not present

## 2014-07-29 DIAGNOSIS — N401 Enlarged prostate with lower urinary tract symptoms: Secondary | ICD-10-CM | POA: Diagnosis not present

## 2014-07-29 DIAGNOSIS — N3281 Overactive bladder: Secondary | ICD-10-CM | POA: Diagnosis not present

## 2014-07-31 DIAGNOSIS — H40053 Ocular hypertension, bilateral: Secondary | ICD-10-CM | POA: Diagnosis not present

## 2014-07-31 DIAGNOSIS — H01004 Unspecified blepharitis left upper eyelid: Secondary | ICD-10-CM | POA: Diagnosis not present

## 2014-07-31 DIAGNOSIS — M25511 Pain in right shoulder: Secondary | ICD-10-CM | POA: Diagnosis not present

## 2014-07-31 DIAGNOSIS — H01001 Unspecified blepharitis right upper eyelid: Secondary | ICD-10-CM | POA: Diagnosis not present

## 2014-07-31 DIAGNOSIS — H2513 Age-related nuclear cataract, bilateral: Secondary | ICD-10-CM | POA: Diagnosis not present

## 2014-08-05 ENCOUNTER — Other Ambulatory Visit: Payer: Self-pay | Admitting: Dermatology

## 2014-08-05 DIAGNOSIS — L821 Other seborrheic keratosis: Secondary | ICD-10-CM | POA: Diagnosis not present

## 2014-08-05 DIAGNOSIS — D2262 Melanocytic nevi of left upper limb, including shoulder: Secondary | ICD-10-CM | POA: Diagnosis not present

## 2014-08-05 DIAGNOSIS — D485 Neoplasm of uncertain behavior of skin: Secondary | ICD-10-CM | POA: Diagnosis not present

## 2014-08-05 DIAGNOSIS — L720 Epidermal cyst: Secondary | ICD-10-CM | POA: Diagnosis not present

## 2014-08-05 DIAGNOSIS — D225 Melanocytic nevi of trunk: Secondary | ICD-10-CM | POA: Diagnosis not present

## 2014-08-05 DIAGNOSIS — L853 Xerosis cutis: Secondary | ICD-10-CM | POA: Diagnosis not present

## 2014-08-05 DIAGNOSIS — L570 Actinic keratosis: Secondary | ICD-10-CM | POA: Diagnosis not present

## 2014-08-05 DIAGNOSIS — Z85820 Personal history of malignant melanoma of skin: Secondary | ICD-10-CM | POA: Diagnosis not present

## 2014-08-05 DIAGNOSIS — Z85828 Personal history of other malignant neoplasm of skin: Secondary | ICD-10-CM | POA: Diagnosis not present

## 2014-08-05 DIAGNOSIS — D1801 Hemangioma of skin and subcutaneous tissue: Secondary | ICD-10-CM | POA: Diagnosis not present

## 2014-08-11 DIAGNOSIS — M25511 Pain in right shoulder: Secondary | ICD-10-CM | POA: Diagnosis not present

## 2014-08-19 DIAGNOSIS — S43014A Anterior dislocation of right humerus, initial encounter: Secondary | ICD-10-CM | POA: Diagnosis not present

## 2014-08-20 DIAGNOSIS — S43014D Anterior dislocation of right humerus, subsequent encounter: Secondary | ICD-10-CM | POA: Diagnosis not present

## 2014-08-20 DIAGNOSIS — Z4789 Encounter for other orthopedic aftercare: Secondary | ICD-10-CM | POA: Diagnosis not present

## 2014-08-25 DIAGNOSIS — Z1389 Encounter for screening for other disorder: Secondary | ICD-10-CM | POA: Diagnosis not present

## 2014-08-25 DIAGNOSIS — R413 Other amnesia: Secondary | ICD-10-CM | POA: Diagnosis not present

## 2014-08-25 DIAGNOSIS — N401 Enlarged prostate with lower urinary tract symptoms: Secondary | ICD-10-CM | POA: Diagnosis not present

## 2014-08-25 DIAGNOSIS — E785 Hyperlipidemia, unspecified: Secondary | ICD-10-CM | POA: Diagnosis not present

## 2014-08-25 DIAGNOSIS — M1990 Unspecified osteoarthritis, unspecified site: Secondary | ICD-10-CM | POA: Diagnosis not present

## 2014-08-25 DIAGNOSIS — Z6826 Body mass index (BMI) 26.0-26.9, adult: Secondary | ICD-10-CM | POA: Diagnosis not present

## 2014-08-25 DIAGNOSIS — R7301 Impaired fasting glucose: Secondary | ICD-10-CM | POA: Diagnosis not present

## 2014-08-25 DIAGNOSIS — G250 Essential tremor: Secondary | ICD-10-CM | POA: Diagnosis not present

## 2014-09-09 DIAGNOSIS — H52201 Unspecified astigmatism, right eye: Secondary | ICD-10-CM | POA: Diagnosis not present

## 2014-09-09 DIAGNOSIS — H25811 Combined forms of age-related cataract, right eye: Secondary | ICD-10-CM | POA: Diagnosis not present

## 2014-09-09 DIAGNOSIS — H25011 Cortical age-related cataract, right eye: Secondary | ICD-10-CM | POA: Diagnosis not present

## 2014-09-09 DIAGNOSIS — H2511 Age-related nuclear cataract, right eye: Secondary | ICD-10-CM | POA: Diagnosis not present

## 2014-09-16 DIAGNOSIS — H25012 Cortical age-related cataract, left eye: Secondary | ICD-10-CM | POA: Diagnosis not present

## 2014-09-16 DIAGNOSIS — H2512 Age-related nuclear cataract, left eye: Secondary | ICD-10-CM | POA: Diagnosis not present

## 2014-09-16 DIAGNOSIS — H52202 Unspecified astigmatism, left eye: Secondary | ICD-10-CM | POA: Diagnosis not present

## 2014-09-16 DIAGNOSIS — H25812 Combined forms of age-related cataract, left eye: Secondary | ICD-10-CM | POA: Diagnosis not present

## 2014-09-23 DIAGNOSIS — H59022 Cataract (lens) fragments in eye following cataract surgery, left eye: Secondary | ICD-10-CM | POA: Diagnosis not present

## 2014-10-15 DIAGNOSIS — G245 Blepharospasm: Secondary | ICD-10-CM | POA: Diagnosis not present

## 2014-10-20 DIAGNOSIS — G245 Blepharospasm: Secondary | ICD-10-CM | POA: Diagnosis not present

## 2015-01-12 DIAGNOSIS — Z85820 Personal history of malignant melanoma of skin: Secondary | ICD-10-CM | POA: Diagnosis not present

## 2015-01-12 DIAGNOSIS — Z85828 Personal history of other malignant neoplasm of skin: Secondary | ICD-10-CM | POA: Diagnosis not present

## 2015-01-12 DIAGNOSIS — L821 Other seborrheic keratosis: Secondary | ICD-10-CM | POA: Diagnosis not present

## 2015-02-11 DIAGNOSIS — G245 Blepharospasm: Secondary | ICD-10-CM | POA: Diagnosis not present

## 2015-04-03 DIAGNOSIS — Z23 Encounter for immunization: Secondary | ICD-10-CM | POA: Diagnosis not present

## 2015-04-17 DIAGNOSIS — N183 Chronic kidney disease, stage 3 (moderate): Secondary | ICD-10-CM | POA: Diagnosis not present

## 2015-04-17 DIAGNOSIS — R7301 Impaired fasting glucose: Secondary | ICD-10-CM | POA: Diagnosis not present

## 2015-04-17 DIAGNOSIS — Z0000 Encounter for general adult medical examination without abnormal findings: Secondary | ICD-10-CM | POA: Diagnosis not present

## 2015-04-17 DIAGNOSIS — E785 Hyperlipidemia, unspecified: Secondary | ICD-10-CM | POA: Diagnosis not present

## 2015-04-17 DIAGNOSIS — Z125 Encounter for screening for malignant neoplasm of prostate: Secondary | ICD-10-CM | POA: Diagnosis not present

## 2015-04-21 DIAGNOSIS — Z0000 Encounter for general adult medical examination without abnormal findings: Secondary | ICD-10-CM | POA: Diagnosis not present

## 2015-04-21 DIAGNOSIS — G4700 Insomnia, unspecified: Secondary | ICD-10-CM | POA: Diagnosis not present

## 2015-04-21 DIAGNOSIS — Z6827 Body mass index (BMI) 27.0-27.9, adult: Secondary | ICD-10-CM | POA: Diagnosis not present

## 2015-04-21 DIAGNOSIS — G43909 Migraine, unspecified, not intractable, without status migrainosus: Secondary | ICD-10-CM | POA: Diagnosis not present

## 2015-04-21 DIAGNOSIS — M1990 Unspecified osteoarthritis, unspecified site: Secondary | ICD-10-CM | POA: Diagnosis not present

## 2015-04-21 DIAGNOSIS — N183 Chronic kidney disease, stage 3 (moderate): Secondary | ICD-10-CM | POA: Diagnosis not present

## 2015-04-21 DIAGNOSIS — E785 Hyperlipidemia, unspecified: Secondary | ICD-10-CM | POA: Diagnosis not present

## 2015-04-21 DIAGNOSIS — M5136 Other intervertebral disc degeneration, lumbar region: Secondary | ICD-10-CM | POA: Diagnosis not present

## 2015-04-21 DIAGNOSIS — K219 Gastro-esophageal reflux disease without esophagitis: Secondary | ICD-10-CM | POA: Diagnosis not present

## 2015-04-21 DIAGNOSIS — R7301 Impaired fasting glucose: Secondary | ICD-10-CM | POA: Diagnosis not present

## 2015-04-21 DIAGNOSIS — N401 Enlarged prostate with lower urinary tract symptoms: Secondary | ICD-10-CM | POA: Diagnosis not present

## 2015-05-18 DIAGNOSIS — J019 Acute sinusitis, unspecified: Secondary | ICD-10-CM | POA: Diagnosis not present

## 2015-05-18 DIAGNOSIS — Z6828 Body mass index (BMI) 28.0-28.9, adult: Secondary | ICD-10-CM | POA: Diagnosis not present

## 2015-06-17 DIAGNOSIS — G245 Blepharospasm: Secondary | ICD-10-CM | POA: Diagnosis not present

## 2015-06-23 ENCOUNTER — Encounter: Payer: Self-pay | Admitting: Gastroenterology

## 2015-07-27 ENCOUNTER — Encounter: Payer: Self-pay | Admitting: Family Medicine

## 2015-07-28 ENCOUNTER — Ambulatory Visit (INDEPENDENT_AMBULATORY_CARE_PROVIDER_SITE_OTHER)
Admission: RE | Admit: 2015-07-28 | Discharge: 2015-07-28 | Disposition: A | Discharge status: Home / Self Care | Payer: Medicare Other | Source: Ambulatory Visit | Attending: Family Medicine | Admitting: Family Medicine

## 2015-07-28 ENCOUNTER — Ambulatory Visit (INDEPENDENT_AMBULATORY_CARE_PROVIDER_SITE_OTHER): Payer: Medicare Other | Admitting: Family Medicine

## 2015-07-28 ENCOUNTER — Encounter: Payer: Self-pay | Admitting: Family Medicine

## 2015-07-28 ENCOUNTER — Other Ambulatory Visit (INDEPENDENT_AMBULATORY_CARE_PROVIDER_SITE_OTHER): Payer: Medicare Other

## 2015-07-28 VITALS — BP 128/80 | HR 76 | Wt 210.0 lb

## 2015-07-28 DIAGNOSIS — M25511 Pain in right shoulder: Secondary | ICD-10-CM

## 2015-07-28 DIAGNOSIS — Z9889 Other specified postprocedural states: Secondary | ICD-10-CM | POA: Diagnosis not present

## 2015-07-28 DIAGNOSIS — M7530 Calcific tendinitis of unspecified shoulder: Secondary | ICD-10-CM | POA: Insufficient documentation

## 2015-07-28 DIAGNOSIS — M6520 Calcific tendinitis, unspecified site: Secondary | ICD-10-CM

## 2015-07-28 NOTE — Assessment & Plan Note (Signed)
Patient does have calcific bursitis of the shoulder. I do believe that there is some calcific tendinitis also noted. Patient does have postsurgical changes and there may be such a amount of retraction previously that patient was not able to have his supraspinatus in anatomical position. This can cause some weakness and would not allow him to have the full range of motion. We discussed different treatment options and patient has elected to continue with conservative therapy. Discussed the range of motion exercises. I would like to get x-rays to rule out any other bony upper belly neck could be contributing. There is enough calcium deposits we can discuss with patient about possible debridement versed possible injections. Patient will try some over-the-counter medications. Patient will like to come back with his significant other and discuss in more detail in 2 weeks' time. Think it would be fine but discussed with him that he will not have full strength or full range of motion likely at all and if he is not having pain conservative management would likely be best.

## 2015-07-28 NOTE — Patient Instructions (Addendum)
Good to see you If any pain then Ice 20 minutes 2 times daily. Usually after activity and before bed. Do as much as you want and tell me if any pain We will get xray to make sure no other bone problem is causing the problem I will call you with the results Bring your wife in 2 weeks and we can discuss repeating an MRI but we do need to decide if that would change management.  Keep working that arm out with that glass.  See you soon.

## 2015-07-28 NOTE — Progress Notes (Signed)
[name] [place], [place] [address] Phone: [phone] Subjective:    I'm seeing this patient by the request  of:  Precious Reel, MD   CC: Right shoulder pain  [id number] Timothy Aguirre is a 82 y.o. male coming in with complaint of right shoulder pain. Patient does have past medical history significant for an anterior dislocation of the shoulder in August of2015. Patient did have it relocated and did have surgery. Patient did go through rehabilitation. Patient states he is not having any significant pain but no full range of motion. Didn't pull weeks of rehabilitation and then another 6 months of the tendon outside facility. Patient does take a oral anti-inflammatory nightly. Patient states that nothing seems to stop him from daily activities except for some of the range of motion. Patient states that there is weakness if he tries to lift it above his head. States that it's never been quite right. Patient is wondering if there is anything else that can be done. No new injuries. No radiation down the arm or any numbness.    patient did bring information. Patient did have a anterior dislocation as well as a posterior labral tear and a rotator cuff injury. Patient did have surgical repair by Dr. Onnie Graham.  Patient did have some arthroscopic pictures with him that did show patient had a complete tear. Moderate arthritic changes especially of the acromium. Patient's anchoring and fixtures in place at the end of surgery. These were all reviewed by me in their entirety  Past Medical History  Diagnosis Date  . GERD (gastroesophageal reflux disease)   . Hyperlipidemia   . BPH (benign prostatic hyperplasia)   . Back pain, chronic   . DJD (degenerative joint disease)   . PAC (premature atrial contraction)   . Hx of colonoscopy    Past Surgical History  Procedure Laterality Date  . Microdickectomy  11/1996  . Acdf  11/2009    C4-5, C5-C6, C6-7  .  Redo decompression  07/30/2009  . Right knee arthroscopy      15 years ago   Social History   Social History  . Marital Status: Married    Spouse Name: N/A  . Number of Children: N/A  . Years of Education: N/A   Social History Main Topics  . Smoking status: Never Smoker   . Smokeless tobacco: Not on file  . Alcohol Use: Yes     Comment: 2-3 oz wine each evening, several glasses on weekends  . Drug Use: No  . Sexual Activity: Not on file   Other Topics Concern  . Not on file   Social History Narrative   Lives in [place].      Works as a Metallurgist.    Allergies  Allergen Reactions  . Cefazolin     unknown  . Cefuroxime Axetil     unknown  . Vancomycin     unknown   Family History  Problem Relation Age of Onset  . Heart attack Father     "enlarged heart" rheumatic fever    Past medical history, social, surgical and family history all reviewed in electronic medical record.  No pertanent information unless stated regarding to the chief complaint.   Review of Systems: No headache, visual changes, nausea, vomiting, diarrhea, constipation, dizziness, abdominal pain, skin rash, fevers, chills, night sweats, weight loss, swollen lymph nodes, body aches, joint swelling, muscle aches, chest pain, shortness of breath, mood changes.   Objective  Blood pressure 128/80, pulse 76, weight 210 lb (95.255 kg), SpO2 95 %.  General: No apparent distress alert and oriented x3 mood and affect normal, dressed appropriately.  HEENT: Pupils equal, extraocular movements intact  Respiratory: Patient's speak in full sentences and does not appear short of breath  Cardiovascular: No lower extremity edema, non tender, no erythema  Skin: Warm dry intact with no signs of infection or rash on extremities or on axial skeleton.  Abdomen: Soft nontender  Neuro: Cranial nerves II through XII are intact, neurovascularly intact in all extremities with 2+ DTRs and 2+ pulses.  Lymph: No  lymphadenopathy of posterior or anterior cervical chain or axillae bilaterally.  Gait normal with good balance and coordination.  MSK:  Non tender with full range of motion and good stability and symmetric strength and tone of  elbows, wrist, hip, knee and ankles bilaterally.  Shoulder: Right Inspection reveals no abnormalities, atrophy or asymmetry. Palpation is normal with no tenderness over AC joint or bicipital groove. Patient does have near full range of motion passively but only active range of motion of 85 of forward flexion, 10 of external rotation and internal rotation to sacrum. Rotator cuff strength 4 out of 5 strength compared to full strength on the contralateral side signs of impingement with positive Neer and Hawkin's tests, but negative empty can sign. Speeds and Yergason's tests normal. No labral pathology noted with negative Obrien's, negative clunk and good stability. Weakness of patient's scapular the right side compared to the lateral side Mild positive drop arm sign No apprehension sign  MSK US performed of: Right This study was ordered, performed, and interpreted by Charlann Boxer D.O.  Shoulder:   Supraspinatus:  Appears normal on long and transverse views postsurgical repairs noted. It does appear that patient's insertion is somewhat anterior and more medial then usual anatomy., Bursal bulge seen with shoulder abduction on impingement view. Significant calcific changes noted Infraspinatus:  Appears normal on long and transverse views. Significant increase in Doppler flow Subscapularis:  Appears normal on long and transverse views. Positive bursa with calcific changes Teres Minor:  Appears normal on long and transverse views. AC joint:  Capsule undistended, no geyser sign. Mild arthritic changes Glenohumeral Joint:  Appears normal without effusion. Glenoid Labrum:  Intact without visualized tears. Biceps Tendon:  Appears normal on long and transverse views, no fraying  of tendon, tendon located in intertubercular groove, no subluxation with shoulder internal or external rotation.  Impression: Subacromial bursitis and calcific with postsurgical repairs noted.     Impression and Recommendations:     This case required medical decision making of moderate complexity.      Note: This dictation was prepared with Dragon dictation along with smaller phrase technology. Any transcriptional errors that result from this process are unintentional.

## 2015-08-03 DIAGNOSIS — N401 Enlarged prostate with lower urinary tract symptoms: Secondary | ICD-10-CM | POA: Diagnosis not present

## 2015-08-03 DIAGNOSIS — R350 Frequency of micturition: Secondary | ICD-10-CM | POA: Diagnosis not present

## 2015-08-03 DIAGNOSIS — N5201 Erectile dysfunction due to arterial insufficiency: Secondary | ICD-10-CM | POA: Diagnosis not present

## 2015-08-03 DIAGNOSIS — Z0000 Encounter for general adult medical examination without abnormal findings: Secondary | ICD-10-CM | POA: Diagnosis not present

## 2015-08-03 DIAGNOSIS — R972 Elevated prostate specific antigen [PSA]: Secondary | ICD-10-CM | POA: Diagnosis not present

## 2015-08-11 DIAGNOSIS — D2271 Melanocytic nevi of right lower limb, including hip: Secondary | ICD-10-CM | POA: Diagnosis not present

## 2015-08-11 DIAGNOSIS — L110 Acquired keratosis follicularis: Secondary | ICD-10-CM | POA: Diagnosis not present

## 2015-08-11 DIAGNOSIS — L851 Acquired keratosis [keratoderma] palmaris et plantaris: Secondary | ICD-10-CM | POA: Diagnosis not present

## 2015-08-11 DIAGNOSIS — D485 Neoplasm of uncertain behavior of skin: Secondary | ICD-10-CM | POA: Diagnosis not present

## 2015-08-11 DIAGNOSIS — D1801 Hemangioma of skin and subcutaneous tissue: Secondary | ICD-10-CM | POA: Diagnosis not present

## 2015-08-11 DIAGNOSIS — L820 Inflamed seborrheic keratosis: Secondary | ICD-10-CM | POA: Diagnosis not present

## 2015-08-11 DIAGNOSIS — Z85828 Personal history of other malignant neoplasm of skin: Secondary | ICD-10-CM | POA: Diagnosis not present

## 2015-08-11 DIAGNOSIS — Z85820 Personal history of malignant melanoma of skin: Secondary | ICD-10-CM | POA: Diagnosis not present

## 2015-08-11 DIAGNOSIS — L720 Epidermal cyst: Secondary | ICD-10-CM | POA: Diagnosis not present

## 2015-08-11 DIAGNOSIS — L821 Other seborrheic keratosis: Secondary | ICD-10-CM | POA: Diagnosis not present

## 2015-08-11 DIAGNOSIS — D225 Melanocytic nevi of trunk: Secondary | ICD-10-CM | POA: Diagnosis not present

## 2015-08-11 DIAGNOSIS — B351 Tinea unguium: Secondary | ICD-10-CM | POA: Diagnosis not present

## 2015-08-12 ENCOUNTER — Ambulatory Visit: Payer: Medicare Other | Admitting: Family Medicine

## 2015-08-20 ENCOUNTER — Ambulatory Visit (INDEPENDENT_AMBULATORY_CARE_PROVIDER_SITE_OTHER): Payer: Medicare Other | Admitting: Family Medicine

## 2015-08-20 ENCOUNTER — Encounter: Payer: Self-pay | Admitting: Family Medicine

## 2015-08-20 ENCOUNTER — Other Ambulatory Visit (INDEPENDENT_AMBULATORY_CARE_PROVIDER_SITE_OTHER): Payer: Medicare Other

## 2015-08-20 VITALS — BP 112/70 | HR 68 | Ht 72.0 in | Wt 210.0 lb

## 2015-08-20 DIAGNOSIS — M25511 Pain in right shoulder: Secondary | ICD-10-CM | POA: Diagnosis not present

## 2015-08-20 DIAGNOSIS — M6520 Calcific tendinitis, unspecified site: Secondary | ICD-10-CM | POA: Diagnosis not present

## 2015-08-20 DIAGNOSIS — M7530 Calcific tendinitis of unspecified shoulder: Secondary | ICD-10-CM

## 2015-08-20 MED ORDER — VITAMIN D (ERGOCALCIFEROL) 1.25 MG (50000 UNIT) PO CAPS: 50000.0000 [IU] | ORAL_CAPSULE | ORAL | Status: DC

## 2015-08-20 NOTE — Patient Instructions (Signed)
Good to see you  We will try vitamin D once weekly for the next 8 weeks Injected the shoulder today as well. Lets hope it goes well Send me a message or call me in 2-3 weeks and tell me how you are doing.  Continue the exercises If no improvement we will get MRI and look at it a little closer See me again otherwise in 6 weeks.

## 2015-08-20 NOTE — Assessment & Plan Note (Signed)
Patient given injection today and tolerated the procedure well. Hoping that this will help accelerate the breakdown of some of the excessive calcium and his shoulder. I do believe the patient does have a significant amount weakness and there is a possibility that there is incompetent C2 the rotator cuff. Advance imaging would be worn 10. Patient though wants to try this as well as a high-dose vitamin D supplementation to help with some of the calcium deposits. No significant improvement I do think advance imaging is warranted. Continue home exercises and other conservative therapy.

## 2015-08-20 NOTE — Progress Notes (Signed)
Pre visit review using our clinic review tool, if applicable. No additional management support is needed unless otherwise documented below in the visit note. 

## 2015-08-20 NOTE — Progress Notes (Signed)
[facility] [place], [place] [address] Phone: [phone] Subjective:      CC: Right shoulder pain f/u  [id number] FREDERIC DULEY is a 82 y.o. male coming in with complaint of right shoulder pain. Patient does have past medical history significant for an anterior dislocation of the shoulder in August of2015. Patient did have it relocated and did have surgery. Patient did go through rehabilitation. Patient states he is not having any significant pain but no full range of motion.  Patient was seen before and did have an ultrasound of the shoulder showing some calcific changes as well as scar tissue formation and significant atrophy of some of the musculature. Patient was to try some home exercises for range of motion. Patient states that it is not made any significant improvement at this time. She did have repeat x-rays. These are independently visualized by me today. Patient's x-ray showed calcific changes at the insertion. Otherwise fairly unremarkable. Patient continues to try to the gym on a regular basis. Has not been approved with any of his strength. Maybe sleeping a little more comfortably at night. Did get all the vitamin supplementations.      Past Medical History  Diagnosis Date  . GERD (gastroesophageal reflux disease)   . Hyperlipidemia   . BPH (benign prostatic hyperplasia)   . Back pain, chronic   . DJD (degenerative joint disease)   . PAC (premature atrial contraction)   . Hx of colonoscopy    Past Surgical History  Procedure Laterality Date  . Microdickectomy  11/1996  . Acdf  11/2009    C4-5, C5-C6, C6-7  . Redo decompression  07/30/2009  . Right knee arthroscopy      15 years ago   Social History   Social History  . Marital Status: Married    Spouse Name: N/A  . Number of Children: N/A  . Years of Education: N/A   Social History Main Topics  . Smoking status: Never Smoker   . Smokeless tobacco: Not on file    . Alcohol Use: Yes     Comment: 2-3 oz wine each evening, several glasses on weekends  . Drug Use: No  . Sexual Activity: Not on file   Other Topics Concern  . Not on file   Social History Narrative   Lives in [place].      Works as a Metallurgist.    Allergies  Allergen Reactions  . Cefazolin     unknown  . Cefuroxime Axetil     unknown  . Vancomycin     unknown   Family History  Problem Relation Age of Onset  . Heart attack Father     "enlarged heart" rheumatic fever    Past medical history, social, surgical and family history all reviewed in electronic medical record.  No pertanent information unless stated regarding to the chief complaint.   Review of Systems: No headache, visual changes, nausea, vomiting, diarrhea, constipation, dizziness, abdominal pain, skin rash, fevers, chills, night sweats, weight loss, swollen lymph nodes, body aches, joint swelling, muscle aches, chest pain, shortness of breath, mood changes.   Objective Blood pressure 112/70, pulse 68, weight 210 lb (95.255 kg), SpO2 96 %.  General: No apparent distress alert and oriented x3 mood and affect normal, dressed appropriately.  HEENT: Pupils equal, extraocular movements intact  Respiratory: Patient's speak in full sentences and does not appear short of breath  Cardiovascular: No lower extremity edema, non tender, no  erythema  Skin: Warm dry intact with no signs of infection or rash on extremities or on axial skeleton.  Abdomen: Soft nontender  Neuro: Cranial nerves II through XII are intact, neurovascularly intact in all extremities with 2+ DTRs and 2+ pulses.  Lymph: No lymphadenopathy of posterior or anterior cervical chain or axillae bilaterally.  Gait normal with good balance and coordination.  MSK:  Non tender with full range of motion and good stability and symmetric strength and tone of  elbows, wrist, hip, knee and ankles bilaterally.  Shoulder: Right Inspection reveals no  abnormalities, atrophy or asymmetry. Palpation is normal with no tenderness over AC joint or bicipital groove. Patient does have near full range of motion passively but only active range of motion of 85 of forward flexion, 10 of external rotation and internal rotation to sacrum. No significant change Rotator cuff strength 4 out of 5 strength compared to full strength on the contralateral side does. Change signs of impingement with positive Neer and Hawkin's tests, but negative empty can sign. Speeds and Yergason's tests normal. No labral pathology noted with negative Obrien's, negative clunk and good stability. Weakness of patient's scapular the right side compared to the lateral side Mild positive drop arm sign No apprehension sign Contralateral shoulder unremarkable  Procedure: Real-time Ultrasound Guided Injection of right glenohumeral joint Device: GE Logiq E  Ultrasound guided injection is preferred based studies that show increased duration, increased effect, greater accuracy, decreased procedural pain, increased response rate with ultrasound guided versus blind injection.  Verbal informed consent obtained.  Time-out conducted.  Noted no overlying erythema, induration, or other signs of local infection.  Skin prepped in a sterile fashion.  Local anesthesia: Topical Ethyl chloride.  With sterile technique and under real time ultrasound guidance:  Joint visualized.  23g 1  inch needle inserted posterior approach. Pictures taken for needle placement. Patient did have injection of 2 cc of 1% lidocaine, 2 cc of 0.5% Marcaine, and 1.0 cc of Kenalog 40 mg/dL. Completed without difficulty  Pain immediately resolved suggesting accurate placement of the medication.  Advised to call if fevers/chills, erythema, induration, drainage, or persistent bleeding.  Images permanently stored and available for review in the ultrasound unit.  Impression: Technically successful ultrasound guided  injection.    Impression and Recommendations:     This case required medical decision making of moderate complexity.      Note: This dictation was prepared with Dragon dictation along with smaller phrase technology. Any transcriptional errors that result from this process are unintentional.

## 2015-09-04 ENCOUNTER — Telehealth: Payer: Self-pay | Admitting: *Deleted

## 2015-09-04 NOTE — Telephone Encounter (Signed)
Left msg on triage stating md wanting him to call back to let him know how he was feeling. Pt states he don't notice any difference he can return call if he want too, but doing ok...Timothy Aguirre

## 2015-10-01 ENCOUNTER — Encounter: Payer: Self-pay | Admitting: Family Medicine

## 2015-10-01 ENCOUNTER — Ambulatory Visit (INDEPENDENT_AMBULATORY_CARE_PROVIDER_SITE_OTHER): Payer: Medicare Other | Admitting: Family Medicine

## 2015-10-01 VITALS — BP 132/80 | HR 61 | Ht 72.0 in | Wt 202.0 lb

## 2015-10-01 DIAGNOSIS — M7530 Calcific tendinitis of unspecified shoulder: Secondary | ICD-10-CM

## 2015-10-01 DIAGNOSIS — M6520 Calcific tendinitis, unspecified site: Secondary | ICD-10-CM | POA: Diagnosis not present

## 2015-10-01 NOTE — Progress Notes (Signed)
Pre visit review using our clinic review tool, if applicable. No additional management support is needed unless otherwise documented below in the visit note. 

## 2015-10-01 NOTE — Assessment & Plan Note (Signed)
Patient is now 6 weeks out from the injection. Discussed with patient at great length. We discussed icing regimen. We discussed that we can do advance imaging the patient would not want any surgical intervention. I do not think anything else an MRI would be shown that would change her treatment. I do think the patient has some acromioclavicular arthritis that could also be contributing. We discussed continuing icing and continuing the range of motion. Patient wants to discuss this with his wife. Encourage him to continue the vitamin D supplementation. Patient can follow-up as needed her right if he has any questions.  Spent  25 minutes with patient face-to-face and had greater than 50% of counseling including as described above in assessment and plan.

## 2015-10-01 NOTE — Patient Instructions (Signed)
Good to see you  See if you can get the per op and op note. Drop them off if you can.  [employer] "total support" online would be great  Good shoes with rigid bottom.  Jalene Mullet, [place] or New balance greater then 700 Stay active.  You know where I am if you need me.

## 2015-10-01 NOTE — Progress Notes (Signed)
[name] [place], [place] [address] Phone: [phone] Subjective:      CC: Right shoulder pain f/u  [id number] Timothy Aguirre is a 82 y.o. male coming in with complaint of right shoulder pain. Patient does have past medical history significant for an anterior dislocation of the shoulder in August of2015. Patient continued to have difficulty overall. Patient was having decreasing range of motion. We tried conservative therapy, formal physical therapy, we also tried injection for the calcific changes of the calcific subacromial bursitis. Patient did not have any significant improvement. States that he is not having any significant pain but at this point continues to have decreased range of motion. I still been unable to obtain the surgical report. It appeared on the ultrasound that rotator cuff did not seem to be as lateral inserted as usual.     Past Medical History  Diagnosis Date  . GERD (gastroesophageal reflux disease)   . Hyperlipidemia   . BPH (benign prostatic hyperplasia)   . Back pain, chronic   . DJD (degenerative joint disease)   . PAC (premature atrial contraction)   . Hx of colonoscopy    Past Surgical History  Procedure Laterality Date  . Microdickectomy  11/1996  . Acdf  11/2009    C4-5, C5-C6, C6-7  . Redo decompression  07/30/2009  . Right knee arthroscopy      15 years ago   Social History   Social History  . Marital Status: Married    Spouse Name: N/A  . Number of Children: N/A  . Years of Education: N/A   Social History Main Topics  . Smoking status: Never Smoker   . Smokeless tobacco: None  . Alcohol Use: Yes     Comment: 2-3 oz wine each evening, several glasses on weekends  . Drug Use: No  . Sexual Activity: Not Asked   Other Topics Concern  . None   Social History Narrative   Lives in [place].      Works as a Metallurgist.    Allergies  Allergen Reactions  . Cefuroxime  Axetil     unknown  . Cefazolin Rash    Severe, generalized unknown  . Vancomycin Rash    unknown   Family History  Problem Relation Age of Onset  . Heart attack Father     "enlarged heart" rheumatic fever    Past medical history, social, surgical and family history all reviewed in electronic medical record.  No pertanent information unless stated regarding to the chief complaint.   Review of Systems: No headache, visual changes, nausea, vomiting, diarrhea, constipation, dizziness, abdominal pain, skin rash, fevers, chills, night sweats, weight loss, swollen lymph nodes, body aches, joint swelling, muscle aches, chest pain, shortness of breath, mood changes.   Objective Blood pressure 132/80, pulse 61, height 6' (1.829 m), weight 202 lb (91.627 kg), SpO2 95 %.  General: No apparent distress alert and oriented x3 mood and affect normal, dressed appropriately.  HEENT: Pupils equal, extraocular movements intact  Respiratory: Patient's speak in full sentences and does not appear short of breath  Cardiovascular: No lower extremity edema, non tender, no erythema  Skin: Warm dry intact with no signs of infection or rash on extremities or on axial skeleton.  Abdomen: Soft nontender  Neuro: Cranial nerves II through XII are intact, neurovascularly intact in all extremities with 2+ DTRs and 2+ pulses.  Lymph: No lymphadenopathy of posterior or anterior cervical chain or axillae  bilaterally.  Gait normal with good balance and coordination.  MSK:  Non tender with full range of motion and good stability and symmetric strength and tone of  elbows, wrist, hip, knee and ankles bilaterally.  Shoulder: Right Inspection reveals no abnormalities, atrophy or asymmetry. Palpation is normal with no tenderness over AC joint or bicipital groove. Patient does have some limitation lacking the last 15 of forward flexion. External rotation seems to be full and internal rotation to sacrum. Mild decrease from  previous exam. Rotator cuff strength 4 out of 5 strength compared to full strength on the contralateral side does.  Positive impingement sign still remaining.Marland Kitchen Speeds and Yergason's tests normal. No labral pathology noted with negative Obrien's, negative clunk and good stability. Weakness of patient's scapular the right side compared to the lateral side Mild positive drop arm sign No apprehension sign Contralateral shoulder unremarkable      Impression and Recommendations:     This case required medical decision making of moderate complexity.      Note: This dictation was prepared with Dragon dictation along with smaller phrase technology. Any transcriptional errors that result from this process are unintentional.

## 2015-10-28 DIAGNOSIS — G245 Blepharospasm: Secondary | ICD-10-CM | POA: Diagnosis not present

## 2015-11-17 ENCOUNTER — Telehealth: Payer: Self-pay

## 2015-11-17 NOTE — Telephone Encounter (Signed)
Patient called and wanting to know if Dr.Smith had a chance to look at the stuff he sent over from the surgeon. And if he could get a call back and let him know what he thought. PLease follow up.

## 2015-11-17 NOTE — Telephone Encounter (Signed)
I did look at surgical report, was a large tear and was unable to put it back where the tendon was supposed to go.  This is the reason.  He did state it specifically in his notes, or at least the ones I was given and states he told you that you would not have full motion.   Does give [place] the reason.

## 2015-11-18 NOTE — Telephone Encounter (Signed)
lmovm for pt to return call.  

## 2015-11-24 NOTE — Telephone Encounter (Signed)
lmovm for pt to return call.  

## 2015-11-24 NOTE — Telephone Encounter (Signed)
Discussed with pt

## 2015-12-07 ENCOUNTER — Encounter: Payer: Self-pay | Admitting: Internal Medicine

## 2015-12-07 ENCOUNTER — Ambulatory Visit (INDEPENDENT_AMBULATORY_CARE_PROVIDER_SITE_OTHER): Payer: Medicare Other | Admitting: Internal Medicine

## 2015-12-07 VITALS — BP 132/82 | HR 77 | Ht 72.0 in | Wt 204.0 lb

## 2015-12-07 DIAGNOSIS — I491 Atrial premature depolarization: Secondary | ICD-10-CM

## 2015-12-07 NOTE — Patient Instructions (Signed)

## 2015-12-07 NOTE — Progress Notes (Signed)
PCP: Precious Reel, MD  Timothy Aguirre is a 82 y.o. male who presents today for routine electrophysiology followup.  Since last being seen in our clinic, the patient reports doing very well.  He remains active and continues to walk on a treadmill without difficulty (3minutes on 3.8 3-5 times per week).  He denies symptoms of PACs.  Today, he denies symptoms of palpitations, chest pain, shortness of breath,  lower extremity edema, dizziness, presyncope, or syncope.  The patient is otherwise without complaint today.   Past Medical History  Diagnosis Date  . GERD (gastroesophageal reflux disease)   . Hyperlipidemia   . BPH (benign prostatic hyperplasia)   . Back pain, chronic   . DJD (degenerative joint disease)   . PAC (premature atrial contraction)   . Hx of colonoscopy    Past Surgical History  Procedure Laterality Date  . Microdickectomy  11/1996  . Acdf  11/2009    C4-5, C5-C6, C6-7  . Redo decompression  07/30/2009  . Right knee arthroscopy      15 years ago    Current Outpatient Prescriptions  Medication Sig Dispense Refill  . aspirin 81 MG EC tablet Take 81 mg by mouth daily.      Marland Kitchen atorvastatin (LIPITOR) 20 MG tablet Take 20 mg by mouth daily.      . finasteride (PROSCAR) 5 MG tablet Take 5 mg by mouth daily.     . fish oil-omega-3 fatty acids 1000 MG capsule Take 2 g by mouth daily.      . Multiple Vitamin (MULTIVITAMIN) capsule Take 1 capsule by mouth daily.      . naproxen sodium (ANAPROX) 220 MG tablet Take 220 mg by mouth at bedtime.     . pantoprazole (PROTONIX) 40 MG tablet Take 40 mg by mouth daily.     . temazepam (RESTORIL) 30 MG capsule Take 30 mg by mouth at bedtime.      No current facility-administered medications for this visit.    Physical Exam: Filed Vitals:   12/07/15 1630  BP: 132/82  Pulse: 77  Height: 6' (1.829 m)  Weight: 204 lb (92.534 kg)    GEN- The patient is well appearing, alert and oriented x 3 today.   Head- normocephalic,  atraumatic Eyes-  Sclera clear, conjunctiva pink Ears- hearing intact Oropharynx- clear Lungs- Clear to ausculation bilaterally, normal work of breathing Heart- Regular rate and rhythm, no murmurs, rubs or gallops, PMI not laterally displaced GI- soft, NT, ND, + BS Extremities- no clubbing, cyanosis, or edema  ekg today reveals sinus rhythm 77 bpm, PR 184, incomplete RBBB   Assessment and Plan:  1. PACs Well controlled No further workup  2. HL Stable No change required today   Return in 1 year  Thompson Grayer MD, [facility] 12/07/2015 5:34 PM

## 2015-12-09 ENCOUNTER — Telehealth: Payer: Self-pay | Admitting: *Deleted

## 2015-12-09 ENCOUNTER — Other Ambulatory Visit: Payer: Self-pay | Admitting: *Deleted

## 2015-12-09 NOTE — Telephone Encounter (Signed)
Patient was seen recently by Dr Rayann Heman. He reports that he was instructed to call with his correct dose for atorvastatin. He takes 10mg  qd. I will update med list.

## 2015-12-09 NOTE — Addendum Note (Signed)
Addended by: Juventino Slovak on: 12/09/2015 11:17 AM   Modules accepted: Medications

## 2015-12-23 IMAGING — CR DG SHOULDER 2+V*R*
2 series · 2 of 2 positions shown · non-contrast
Comparison: None.

CLINICAL DATA: 79-year-old male with right shoulder injury and
pain.

EXAM:
RIGHT SHOULDER - 2+ VIEW

[w shoulder external right]
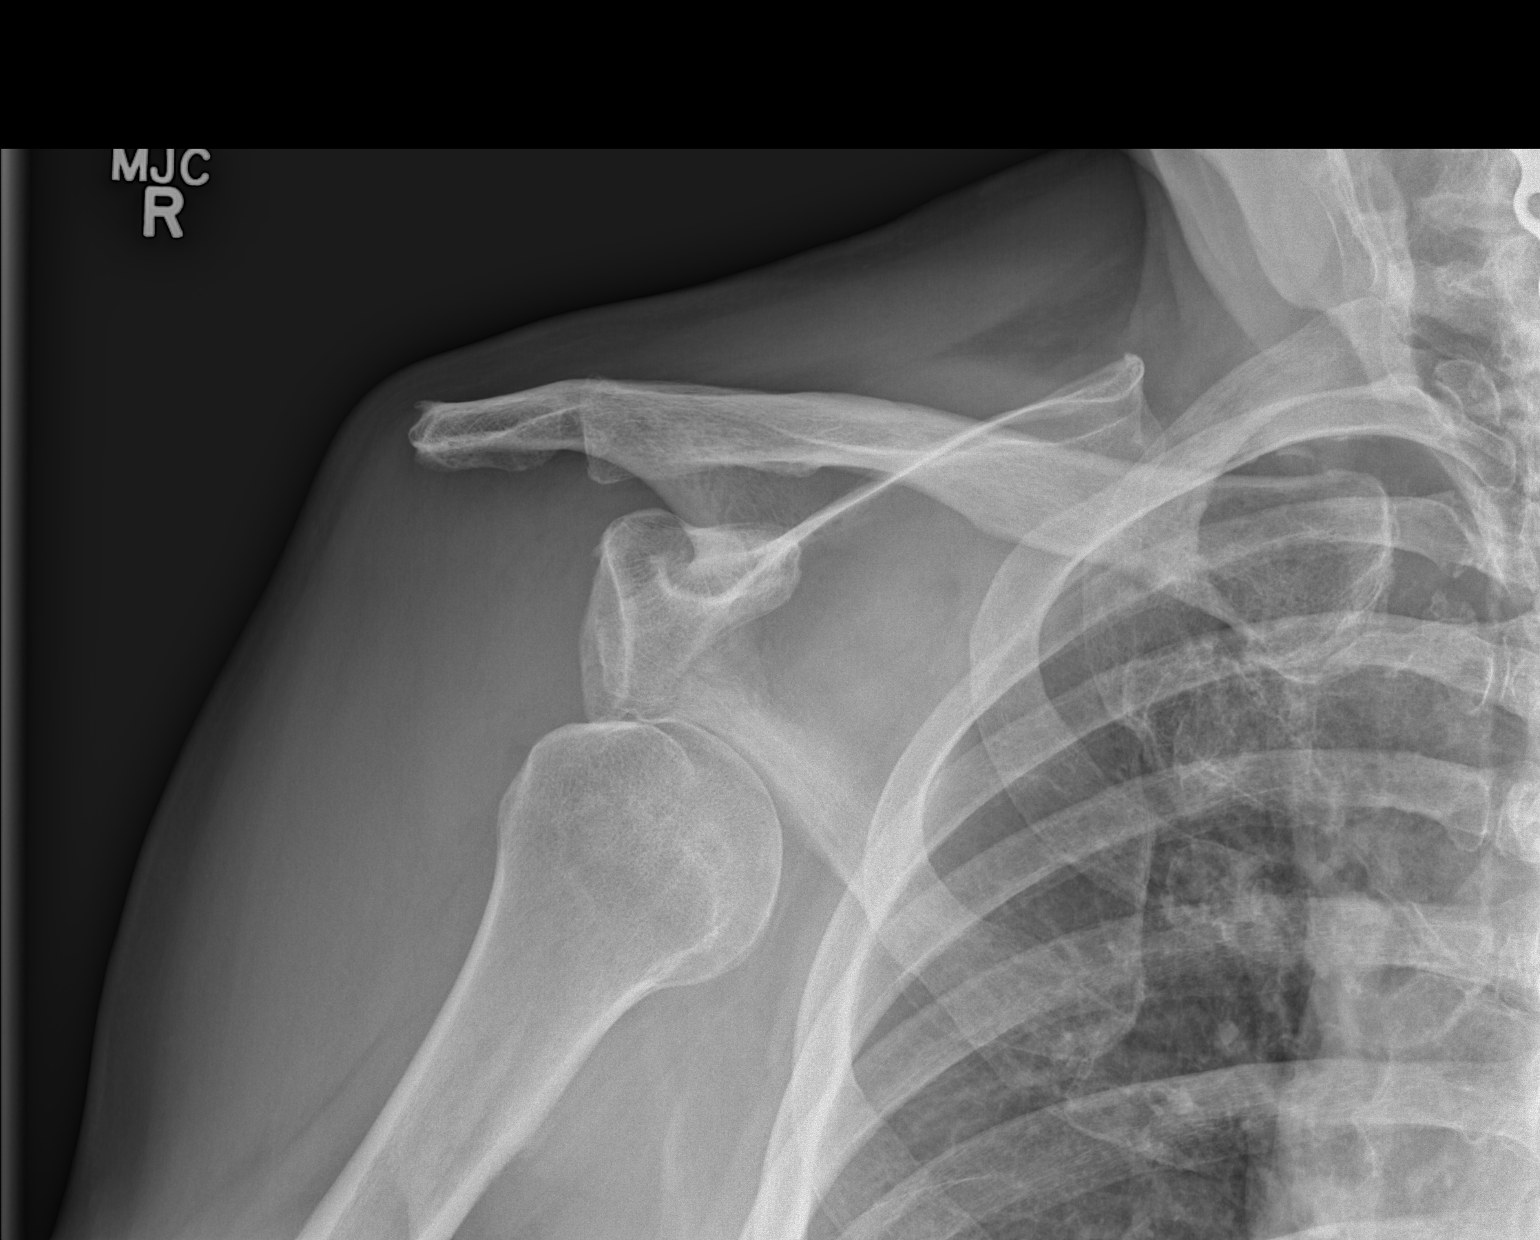

[w shoulder y-view right]
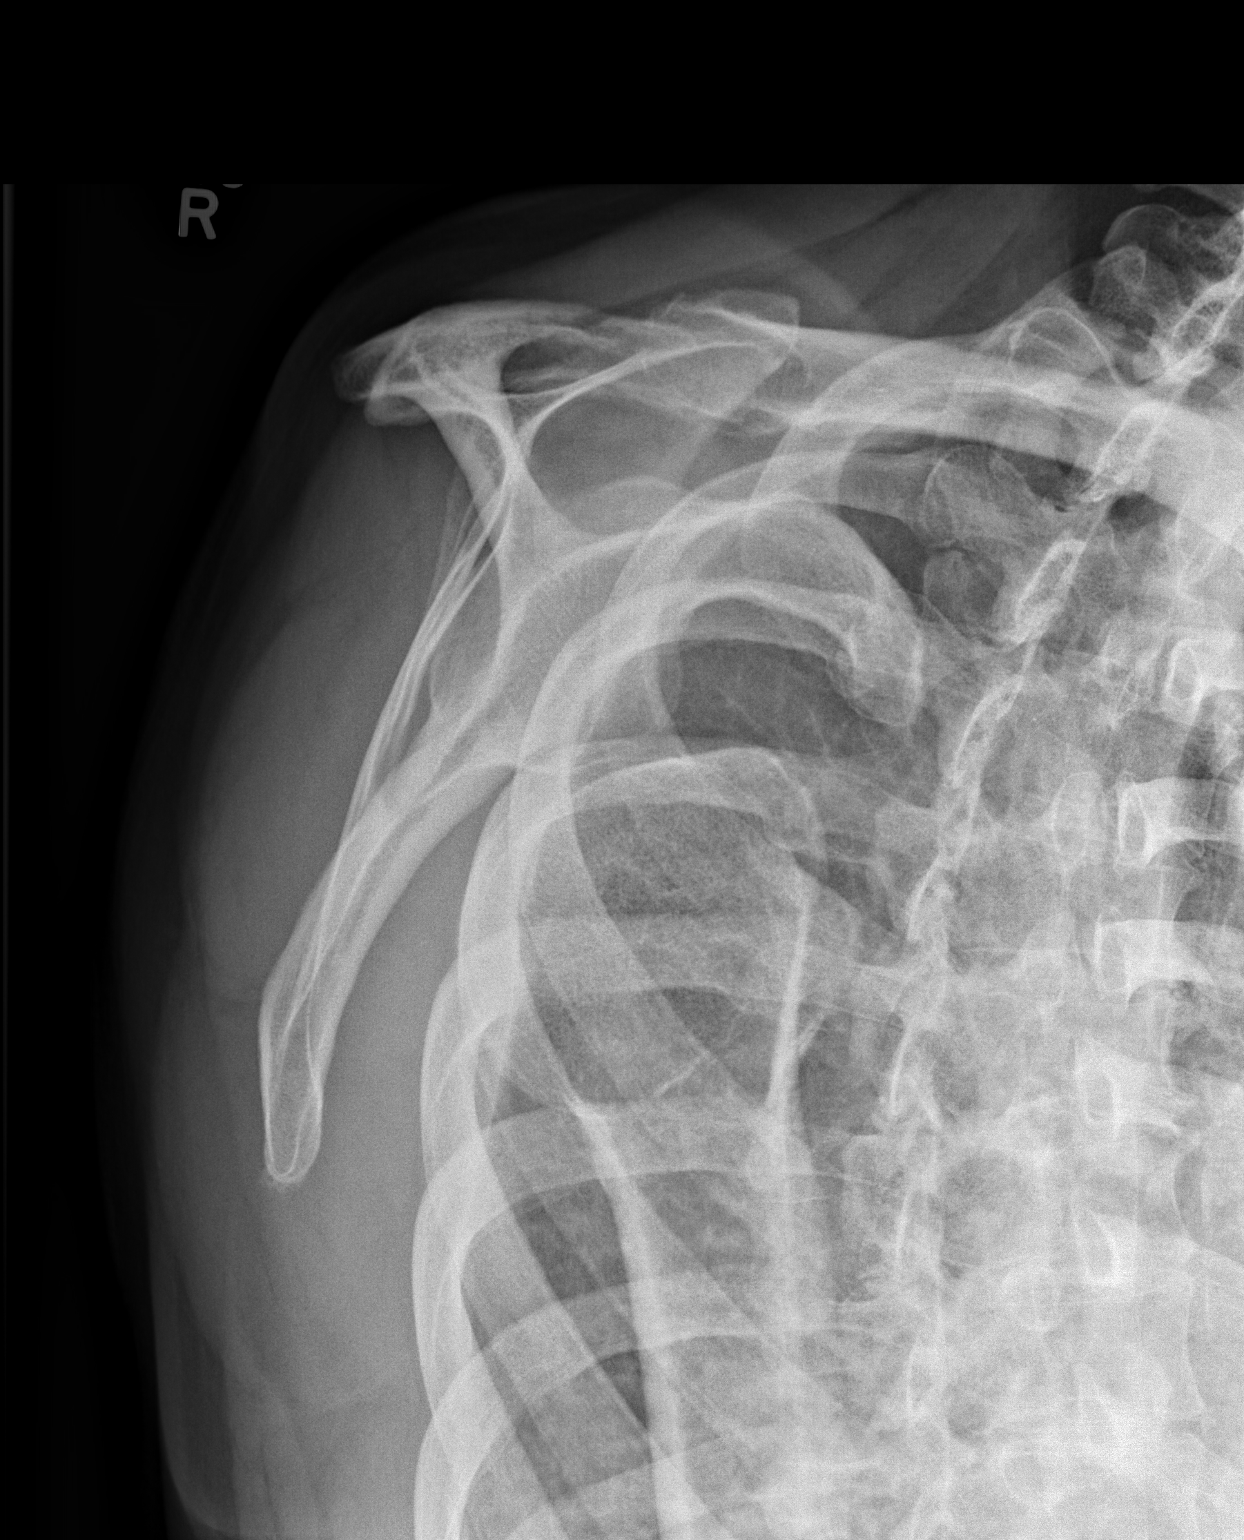

[2 of 2 positions shown; findings below may reference images not displayed]

FINDINGS: Anterior inferior right humeral head dislocation is identified.

No definite fracture is noted.

The visualized right bony thorax is unremarkable otherwise.
IMPRESSION: Anterior inferior dislocation of the right humeral head. No definite
fracture.

## 2015-12-23 IMAGING — CR DG SHOULDER 2+V PORT*R*
1 series · 2 of 2 positions shown · non-contrast
Comparison: Film earlier this day

CLINICAL DATA: Right shoulder dislocation-postreduction.

EXAM:
PORTABLE RIGHT SHOULDER - 2+ VIEW

[Series 1: ap int/ext rotation · right · 2 of 2 slices shown]
[im 1/2]
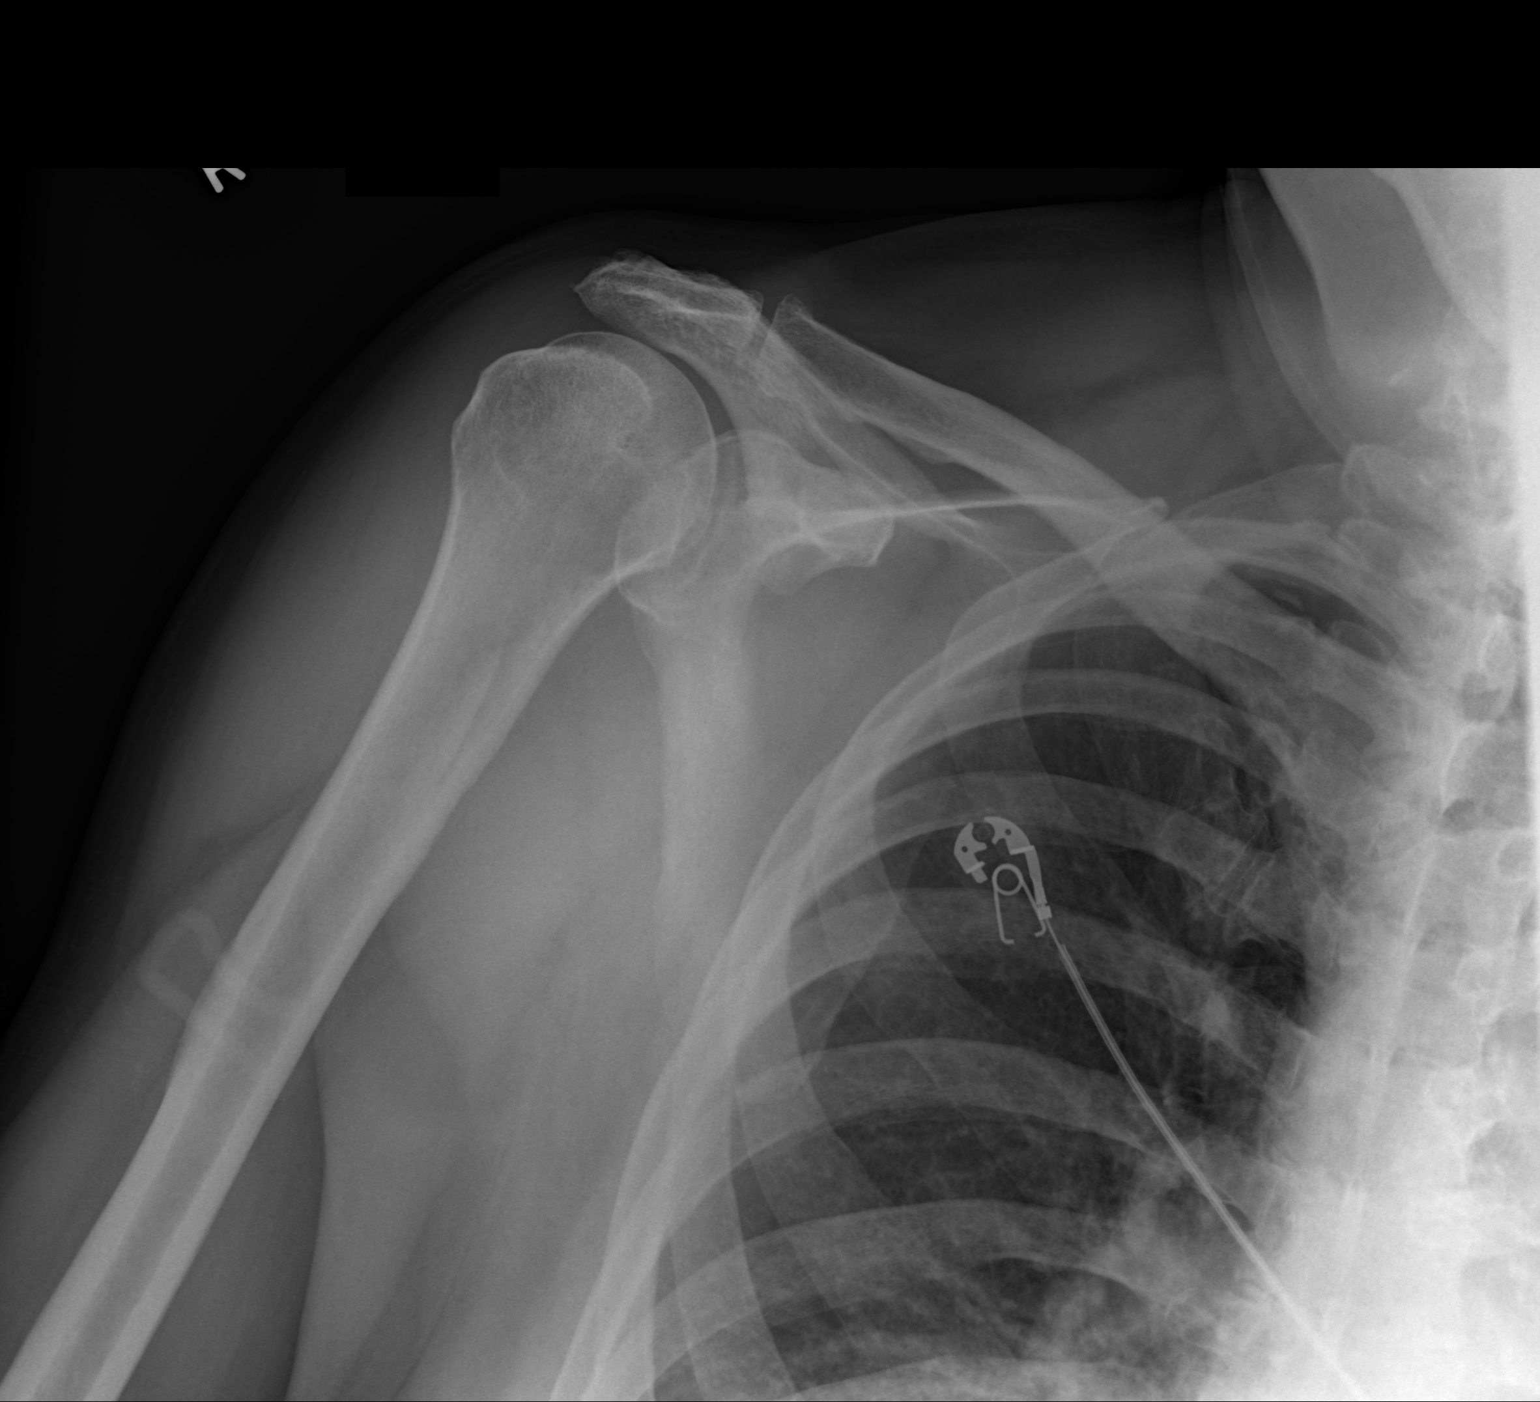
[im 2/2]
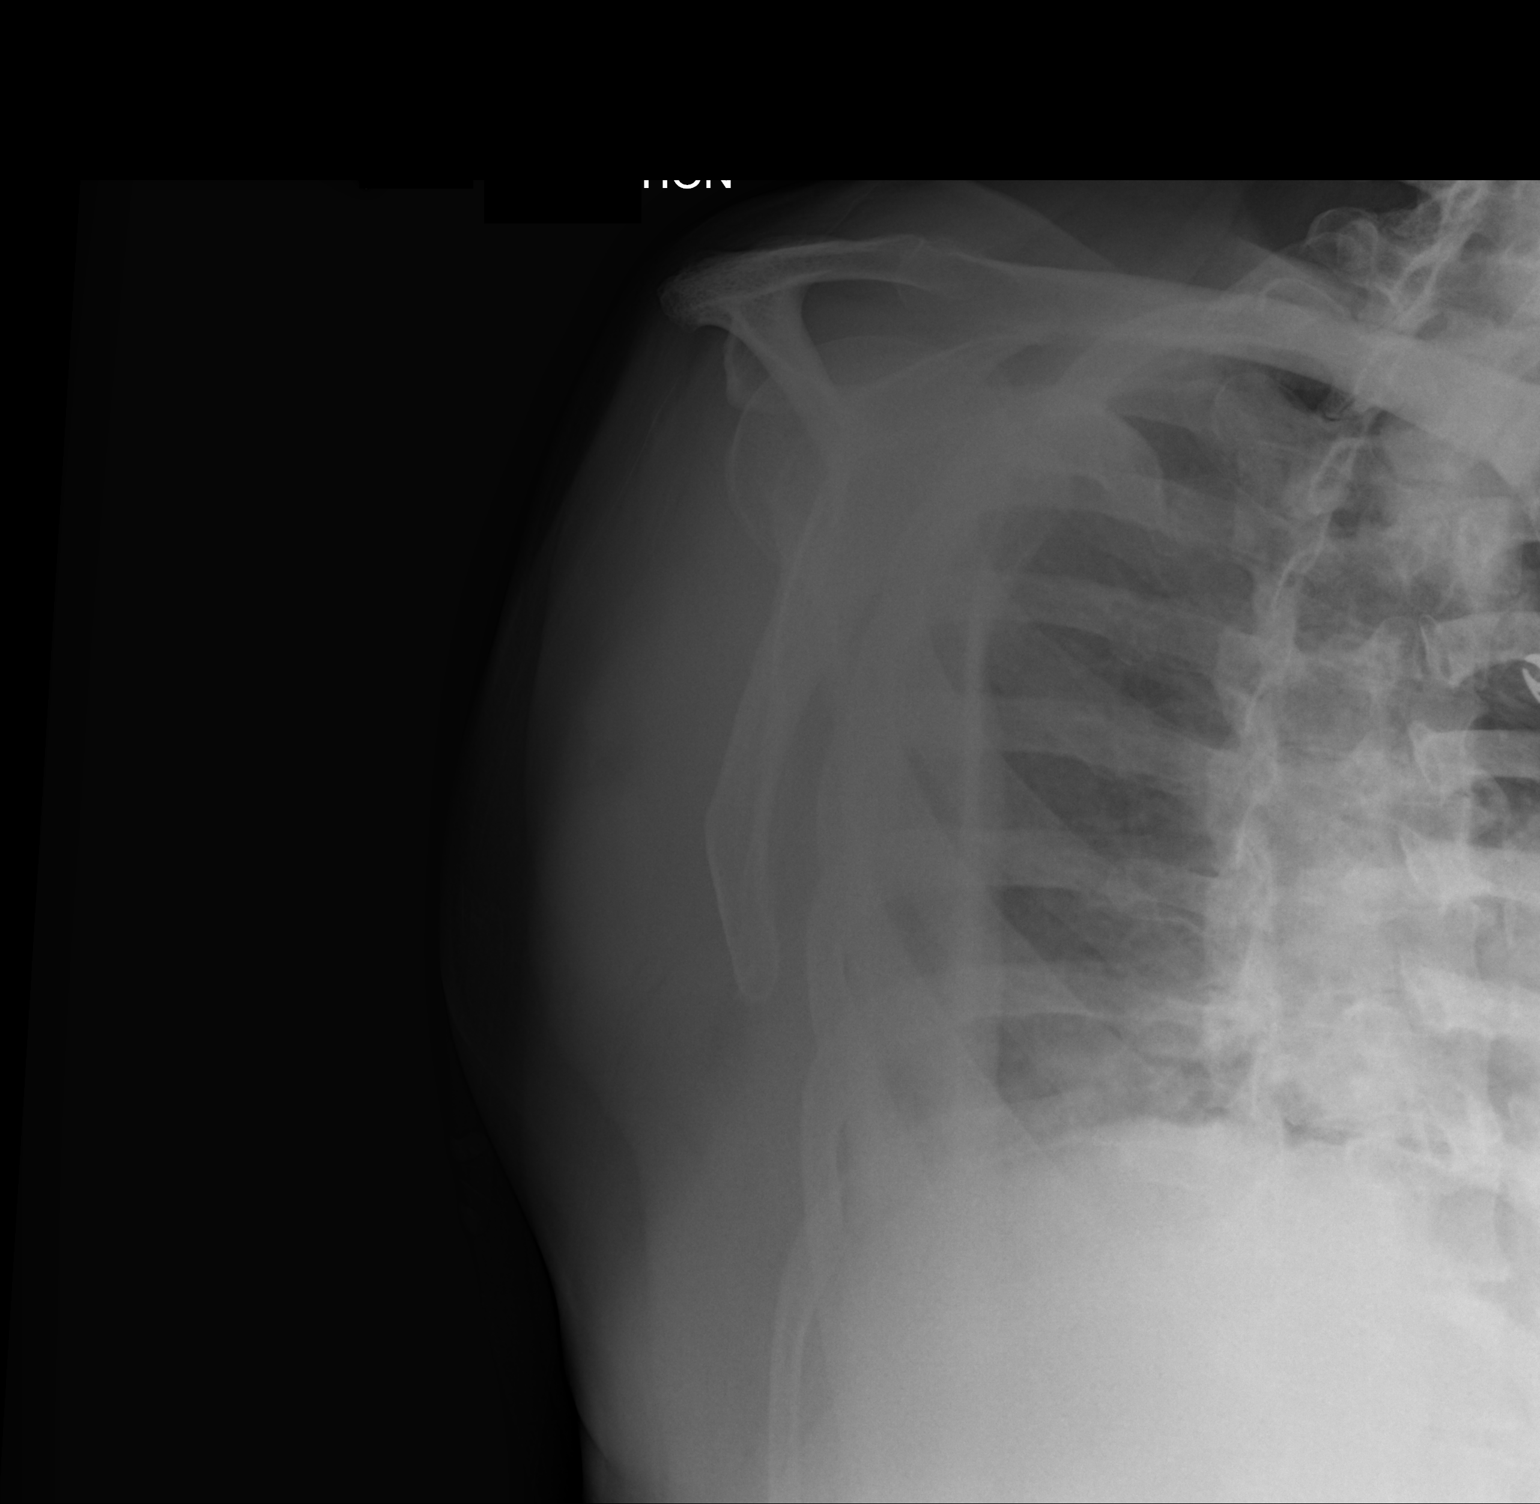

[2 of 2 positions shown; findings below may reference images not displayed]

FINDINGS: The right humeral head is now located.

There is no evidence of fracture, subluxation or dislocation.

No other bony abnormalities are present.
IMPRESSION: Relocation of the right humeral head.

## 2015-12-24 ENCOUNTER — Other Ambulatory Visit: Payer: Self-pay

## 2015-12-24 ENCOUNTER — Encounter: Payer: Self-pay | Admitting: Physician Assistant

## 2015-12-24 ENCOUNTER — Telehealth: Payer: Self-pay | Admitting: Internal Medicine

## 2015-12-24 ENCOUNTER — Ambulatory Visit (INDEPENDENT_AMBULATORY_CARE_PROVIDER_SITE_OTHER): Payer: Medicare Other | Admitting: Physician Assistant

## 2015-12-24 VITALS — BP 130/70 | HR 88 | Ht 72.0 in | Wt 211.4 lb

## 2015-12-24 DIAGNOSIS — R1013 Epigastric pain: Secondary | ICD-10-CM | POA: Diagnosis not present

## 2015-12-24 DIAGNOSIS — R5383 Other fatigue: Secondary | ICD-10-CM

## 2015-12-24 DIAGNOSIS — I491 Atrial premature depolarization: Secondary | ICD-10-CM

## 2015-12-24 LAB — COMPREHENSIVE METABOLIC PANEL
ALT: 23 U/L (ref 9–46)
AST: 24 U/L (ref 10–35)
Albumin: 4.1 g/dL (ref 3.6–5.1)
Alkaline Phosphatase: 58 U/L (ref 40–115)
BILIRUBIN TOTAL: 0.7 mg/dL (ref 0.2–1.2)
BUN: 16 mg/dL (ref 7–25)
CO2: 27 mmol/L (ref 20–31)
CREATININE: 1.26 mg/dL — AB (ref 0.70–1.18)
Calcium: 9.6 mg/dL (ref 8.6–10.3)
Chloride: 98 mmol/L (ref 98–110)
Glucose, Bld: 114 mg/dL — ABNORMAL HIGH (ref 65–99)
Potassium: 4.1 mmol/L (ref 3.5–5.3)
SODIUM: 137 mmol/L (ref 135–146)
Total Protein: 6.7 g/dL (ref 6.1–8.1)

## 2015-12-24 LAB — CBC
HCT: 45.4 % (ref 38.5–50.0)
Hemoglobin: 15.4 g/dL (ref 13.2–17.1)
MCH: 30.6 pg (ref 27.0–33.0)
MCHC: 33.9 g/dL (ref 32.0–36.0)
MCV: 90.1 fL (ref 80.0–100.0)
MPV: 10 fL (ref 7.5–12.5)
PLATELETS: 215 10*3/uL (ref 140–400)
RBC: 5.04 MIL/uL (ref 4.20–5.80)
RDW: 13.8 % (ref 11.0–15.0)
WBC: 13.7 10*3/uL — AB (ref 3.8–10.8)

## 2015-12-24 LAB — TSH: TSH: 1.37 mIU/L (ref 0.40–4.50)

## 2015-12-24 LAB — TROPONIN I: Troponin I: <0.03 ng/mL (ref <0.031)

## 2015-12-24 LAB — LIPASE: Lipase: 179 U/L — ABNORMAL HIGH (ref 7–60)

## 2015-12-24 MED ORDER — SUCRALFATE 1 G PO TABS: 1.0000 g | ORAL_TABLET | Freq: Four times a day (QID) | ORAL | Status: AC

## 2015-12-24 MED ORDER — PANTOPRAZOLE SODIUM 40 MG PO TBEC: DELAYED_RELEASE_TABLET | ORAL | Status: DC

## 2015-12-24 MED ORDER — PANTOPRAZOLE SODIUM 40 MG PO TBEC: 40.0000 mg | DELAYED_RELEASE_TABLET | Freq: Two times a day (BID) | ORAL | Status: AC

## 2015-12-24 NOTE — Addendum Note (Signed)
Addended by: Eulis Foster on: 12/24/2015 04:52 PM   Modules accepted: Orders

## 2015-12-24 NOTE — Patient Instructions (Signed)
Medication Instructions:  Your physician has recommended you make the following change in your medication:  1.  INCREASE the Protonix to 40 mg taking 2 tablets twice a day 2.  STOP the Naproxen for now 3.  START the Carafate 1 g takine 1 tablet four times a day for 2 weeks   Labwork: TODAY:  TROPONIN, BMET, CMET, TSH, & LIPASE  Testing/Procedures: None ordered  Follow-Up: Your physician recommends that you schedule a follow-up appointment in:  ASAP at [place] physician recommends that you schedule a follow-up appointment in: [place], PA-C   Any Other Special Instructions Will Be Listed Below (If Applicable). You have been referred to [facility].  It is ok for you to see a NP/PA.  STOP all the ALCOHOL for now  If you need a refill on your cardiac medications before your next appointment, please call your pharmacy.

## 2015-12-24 NOTE — Addendum Note (Signed)
Addended by: Eulis Foster on: 12/24/2015 04:58 PM   Modules accepted: Orders

## 2015-12-24 NOTE — Progress Notes (Addendum)
Cardiology Office Note    Date:  12/24/2015  ID:  Timothy Aguirre, DOB Apr 25, 1934, MRN [id number] PCP:  Precious Reel, MD  Cardiologist:  Dr. Rayann Heman   Chief Complaint: indigestion  History of Present Illness:  Timothy Aguirre is a 82 y.o. male with history of GERD, habitual alcohol intake, HLD, BPH, PACs who presented to the office as an add-on for evaluation of "indigestion." He has no history of CAD. 2D Echo 11/2010 showed EF 55-60%, grade 1 DD, mild AI, mild RAE.  He called his PCP's office reporting indigestion/stomach discomfort and was told to call cardiology for an appointment today. He has been on vacation the past week and has been drinking alcohol nightly. He usually only drinks on the weekends. 2 days ago, he had fried chicken and 2 rum drinks - later that evening, the pain started. Since that time, he's had a constant dull ache in his epigastrum, rated 4/10. It is not made worse by meals, positional changes, movement, exertion, or palpation. It is somewhat relieved by burping. He has tried Protonix 40mg  daily, Pepcid AC, and Tums without relief. He takes Naproxen nightly. He has not noticed any BRBPR, melena, or hematemesis. He denies any SOB, palpitations, nausea, vomiting, or syncope. He is fairly active and has no history of angina. He has noticed feeling more fatigued the last few days. No fevers.  Past Medical History  Diagnosis Date  . GERD (gastroesophageal reflux disease)   . Hyperlipidemia   . BPH (benign prostatic hyperplasia)   . Back pain, chronic   . DJD (degenerative joint disease)   . Premature atrial contractions   . Hx of colonoscopy     Past Surgical History  Procedure Laterality Date  . Microdickectomy  11/1996  . Acdf  11/2009    C4-5, C5-C6, C6-7  . Redo decompression  07/30/2009  . Right knee arthroscopy      15 years ago    Current Medications: Current Outpatient Prescriptions  Medication Sig Dispense Refill  . aspirin 81 MG EC tablet Take 81  mg by mouth daily.      Marland Kitchen atorvastatin (LIPITOR) 10 MG tablet Take 10 mg by mouth daily.    . finasteride (PROSCAR) 5 MG tablet Take 5 mg by mouth daily.     . fish oil-omega-3 fatty acids 1000 MG capsule Take 2 g by mouth daily.      . Multiple Vitamin (MULTIVITAMIN) capsule Take 1 capsule by mouth daily.      . naproxen sodium (ANAPROX) 220 MG tablet Take 220 mg by mouth at bedtime.     . temazepam (RESTORIL) 30 MG capsule Take 30 mg by mouth at bedtime.     . [DISCONTINUED] pantoprazole (PROTONIX) 40 MG tablet Take 40 mg by mouth daily.      No current facility-administered medications for this visit.     Allergies:   Cefuroxime axetil; Cefazolin; and Vancomycin   Social History   Social History  . Marital Status: Married    Spouse Name: N/A  . Number of Children: N/A  . Years of Education: N/A   Social History Main Topics  . Smoking status: Never Smoker   . Smokeless tobacco: None  . Alcohol Use: 0.0 oz/week    0 Standard drinks or equivalent per week     Comment: drinks liquor on the weekends  . Drug Use: No  . Sexual Activity: Not Asked   Other Topics Concern  . None   Social History  Narrative   Lives in [place].      Works as a Metallurgist.      Family History:  The patient's family history includes Heart attack in his father. There is no history of Hypertension.  ROS:   Please see the history of present illness. All other systems are reviewed and otherwise negative.    PHYSICAL EXAM:   VS:  BP 130/70 mmHg  Pulse 88  Ht 6' (1.829 m)  Wt 211 lb 6.4 oz (95.89 kg)  BMI 28.66 kg/m2  SpO2 97%  BMI: Body mass index is 28.66 kg/(m^2). GEN: Well nourished, well developed WM, in no acute distress HEENT: normocephalic, atraumatic Neck: no JVD, carotid bruits, or masses Cardiac: RRR frequent ectopy; no murmurs, rubs, or gallops, no edema  Respiratory:  clear to auscultation bilaterally, normal work of breathing GI: soft, nontender, nondistended, +  BS MS: no deformity or atrophy Skin: warm and dry, no rash Neuro:  Alert and Oriented x 3, Strength and sensation are intact, follows commands Psych: euthymic mood, full affect  Wt Readings from Last 3 Encounters:  12/24/15 211 lb 6.4 oz (95.89 kg)  12/07/15 204 lb (92.534 kg)  10/01/15 202 lb (91.627 kg)      Studies/Labs Reviewed:   EKG:  EKG was ordered today and personally reviewed by me and demonstrates NSR 86bpm with occasional PACs, incomplete RBBB  Additional studies/ records that were reviewed today include: Summarized above.    ASSESSMENT & PLAN:   1. Epigastric pain/indigestion - symptoms are suspicious for gastritis or peptic ulcer disease. Less likely to be cardiac given unremarkable EKG. Given constant nature of pain, will run stat troponin. If this is negative, the likelihood of cardiac etiology is incredibly low. Will also check CMET and lipase. Will increase Protonix to 40mg  BID and add Carafate 1g QID. I asked him to stop Naproxen. Will place referral for GI as he may require endoscopy given persistent nature of discomfort. We discussed ER precautions in the interim. Addendum: troponin negative, so unlikely to be cardiac in nature. WBC and lipase elevated, concerning for pancreatitis. Nurse will be calling patient this AM to have him call his PCP to discuss further evaluation and treatment. Would continue empiric RX for PUD in the interim. 2. Fatigue - check labs as above. BP and HR are normal. 3. PACs - asymptomatic. F/u per Dr. Rayann Heman. 4. Hyperlipidemia - followed by primary care.  Disposition: F/u with me in 1 month.   Medication Adjustments/Labs and Tests Ordered: Protonix was originally sent in erroneously as 80mg  BID - we discussed the correction with the patient in the office. I called [facility] to update them that it should be 40mg  BID. Current medicines are reviewed at length with the patient today.  Concerns regarding medicines are outlined above. Medication  changes, Labs and Tests ordered today are summarized above and listed in the Patient Instructions accessible in Encounters.   Raechel Ache PA-C  12/24/2015 4:33 PM    [facility] [address], [place], [place]  [address] Phone: [phone]; Fax: [phone]

## 2015-12-24 NOTE — Addendum Note (Signed)
Addended by: Eulis Foster on: 12/24/2015 04:51 PM   Modules accepted: Orders

## 2015-12-24 NOTE — Telephone Encounter (Signed)
Reviewed with Melina Copa, PA and she can see Timothy Aguirre today at 4:00.  I spoke with Timothy Aguirre and he will be here for appt today

## 2015-12-24 NOTE — Telephone Encounter (Signed)
Spoke with pt who gave me permission to speak with his wife.  Pt and wife report pt developed pain in his stomach 3 days ago. He describes as a faint constant low grade pain. Is not in his chest. No radiation of pain. No other symptoms. Not worse with exertion. Pt was recently at the beach and ate a lot of fried foods and did not have problems.  Tuesday evening he ate fried chicken and had 2 rum drinks.  Pain started later that evening. It was worse the first day and has improved some.  He has changed to a bland diet but is still having pain.  He has tried Nexium, TUMS and Pepcid without relief.  He sees Dr. Rayann Heman for PAC's but no other cardiac history.  Pt and wife report they spoke with Dr. Keane Police office and were advised to call our office.  They are requesting office visit for pt. Will review with provider in office.

## 2015-12-24 NOTE — Addendum Note (Signed)
Addended by: Eulis Foster on: 12/24/2015 04:59 PM   Modules accepted: Orders

## 2015-12-24 NOTE — Telephone Encounter (Signed)
New Message  Pt wife calling to speak w/ RN- she wanted pt to be seen today for indigestion. Pt wife was told that she needed to follow up w/ Pt's PCP for this type of issue. She stated that she spoke w/ Dr Keane Police nurse who advised for pt to be seen today. Please call back and discuss.

## 2015-12-25 NOTE — Telephone Encounter (Signed)
Pt scheduled to see Alonza Bogus PA 01/06/16@8 :30am for epigastric pain. Ivin Booty aware and to notify pt of appt.

## 2015-12-28 ENCOUNTER — Other Ambulatory Visit: Payer: Self-pay | Admitting: Internal Medicine

## 2015-12-28 ENCOUNTER — Ambulatory Visit
Admission: RE | Admit: 2015-12-28 | Discharge: 2015-12-28 | Disposition: A | Discharge status: Home / Self Care | Payer: Medicare Other | Source: Ambulatory Visit | Attending: Internal Medicine | Admitting: Internal Medicine

## 2015-12-28 ENCOUNTER — Telehealth: Payer: Self-pay | Admitting: Internal Medicine

## 2015-12-28 DIAGNOSIS — R748 Abnormal levels of other serum enzymes: Secondary | ICD-10-CM

## 2015-12-28 DIAGNOSIS — R1013 Epigastric pain: Secondary | ICD-10-CM

## 2015-12-28 DIAGNOSIS — N281 Cyst of kidney, acquired: Secondary | ICD-10-CM | POA: Diagnosis not present

## 2015-12-28 DIAGNOSIS — R110 Nausea: Secondary | ICD-10-CM

## 2015-12-28 MED ORDER — IOPAMIDOL (ISOVUE-300) INJECTION 61%
125.0000 mL | Freq: Once | INTRAVENOUS | Status: AC | PRN
  Administered 2015-12-28: 125 mL via INTRAVENOUS

## 2015-12-28 NOTE — Telephone Encounter (Signed)
NeW Message  Pt calling to speak w/ RN- wanted to see if 7/25 appt is necessary. Please call back and discuss.

## 2015-12-28 NOTE — Telephone Encounter (Signed)
Does not need to keep appt if he is feeling better. Can f/u with Dr. Rayann Heman as previously recommended. Dayna Dunn PA-C

## 2015-12-28 NOTE — Telephone Encounter (Signed)
Returned pts call to let him know that per Melina Copa, PA-C, he doesn't need to keep the 01/26/16 appt for f/u if he is feeling better.

## 2015-12-28 NOTE — Addendum Note (Signed)
Addended by: Freada Bergeron on: 12/28/2015 08:35 AM   Modules accepted: Orders

## 2015-12-29 DIAGNOSIS — R748 Abnormal levels of other serum enzymes: Secondary | ICD-10-CM | POA: Diagnosis not present

## 2015-12-30 ENCOUNTER — Encounter: Payer: Self-pay | Admitting: *Deleted

## 2016-01-01 ENCOUNTER — Encounter: Payer: Self-pay | Admitting: Physician Assistant

## 2016-01-01 ENCOUNTER — Ambulatory Visit (INDEPENDENT_AMBULATORY_CARE_PROVIDER_SITE_OTHER): Payer: Medicare Other | Admitting: Physician Assistant

## 2016-01-01 VITALS — BP 124/80 | HR 82 | Ht 71.75 in | Wt 203.0 lb

## 2016-01-01 DIAGNOSIS — K859 Acute pancreatitis without necrosis or infection, unspecified: Secondary | ICD-10-CM

## 2016-01-01 NOTE — Progress Notes (Addendum)
Patient ID: Timothy Aguirre, male   DOB: 14-Sep-1933, 82 y.o.   MRN: [id number]   Subjective:    Patient ID: Timothy Aguirre, male    DOB: October 18, 1933, 82 y.o.   MRN: [id number]  HPI [name] is a very nice 82 year old white male referred today by Dr. Virgina Jock with acute pancreatitis. Patient is previously known to Dr. Sharlett Iles, and was last seen in 2011. He has history of GERD, tachycardia, and hyperlipidemia. Patient relates that he and his wife had been on vacation at the beach in mid June and after 5-6 days of drinking alcohol on a daily basis and overeating he woke up on the morning of the 22nd believe with "bad indigestion". This was nonradiating and located in the epigastrium. He did not have any associated nausea or vomiting, no fever or chills no diarrhea he did feel very tired. He was initially referred to cardiology and was seen on the 22nd, labs were drawn and lipase returned 179 VBC 13.7 hemoglobin 15.4 hematocrit of 45.4 and hepatic panel was normal. It was felt by his history as well that he had acute pancreatitis likely alcohol induced. He then had subsequent CT scan on 12/28/2015 with contrast that showed an unremarkable gallbladder, very subtle ill-definition of the fat planes adjacent to the head of the pancreas is minimal hazy attenuation consistent with possible early acute pancreatitis, also noted a left inguinal hernia containing fat. Patient had seen Dr. Virgina Jock on 6/27 and had labs repeated. His wife states that the lipase and WBC were back to normal. He says his symptoms have completely resolved at this point he has no further abdominal discomfort indigestion etc. and is eating fine. He has abstained from alcohol and has been eating low-fat. Patient says he definitely drank  more alcohol on vacation then he generally drinks with at least 2-3 alcoholic beverages per day. He says he hasn't had any since then ,and that has not been a problem. He says that alcohol has definitely been part of  his life, but he does not think he has a problem with alcohol. He does want to know when it would be okay to drink alcohol again.  Review of Systems Pertinent positive and negative review of systems were noted in the above HPI section.  All other review of systems was otherwise negative.  Outpatient Encounter Prescriptions as of 01/01/2016  Medication Sig  . aspirin 81 MG EC tablet Take 81 mg by mouth daily.    Marland Kitchen atorvastatin (LIPITOR) 10 MG tablet Take 10 mg by mouth daily.  . finasteride (PROSCAR) 5 MG tablet Take 5 mg by mouth daily.   . fish oil-omega-3 fatty acids 1000 MG capsule Take 2 g by mouth daily.    . Multiple Vitamin (MULTIVITAMIN) capsule Take 1 capsule by mouth daily.    . pantoprazole (PROTONIX) 40 MG tablet Take 1 tablet (40 mg total) by mouth 2 (two) times daily.  . sucralfate (CARAFATE) 1 g tablet Take 1 tablet (1 g total) by mouth 4 (four) times daily.  . temazepam (RESTORIL) 30 MG capsule Take 30 mg by mouth at bedtime.   . [DISCONTINUED] naproxen sodium (ANAPROX) 220 MG tablet Take 220 mg by mouth at bedtime.    No facility-administered encounter medications on file as of 01/01/2016.   Allergies  Allergen Reactions  . Cefuroxime Axetil Other (See Comments)    unknown  . Cefazolin Rash    Severe, generalized   . Vancomycin Rash    unknown  Patient Active Problem List   Diagnosis Date Noted  . Calcific bursitis of shoulder 07/28/2015  . History of rotator cuff surgery 07/28/2015  . Blepharospasm 10/15/2014  . NS (nuclear sclerosis) 01/02/2013  . ATRIAL PREMATURE BEATS 05/13/2010  . GERD 04/19/2010  . DYSPNEA 03/10/2010  . Other specified cardiac dysrhythmias(427.89) 03/04/2010  . UNSPECIFIED PREMATURE BEATS 01/28/2010  . CUTANEOUS ERUPTIONS, DRUG-INDUCED 10/09/2009  . TACHYCARDIA 09/29/2009  . FATIGUE, ACUTE 09/24/2009  . CHEST PAIN UNSPECIFIED 09/09/2009  . PRURITUS 09/01/2009  . OTH INFS INVLV BONE DZ CLASS ELSW OTH SPEC SITES 09/01/2009  . INSOMNIA  09/01/2009   Social History   Social History  . Marital Status: Married    Spouse Name: Olegario Shearer  . Number of Children: 3  . Years of Education: N/A   Occupational History  . insurance/investments    Social History Main Topics  . Smoking status: Never Smoker   . Smokeless tobacco: Never Used  . Alcohol Use: 0.0 oz/week    0 Standard drinks or equivalent per week     Comment: drinks liquor on the weekends  . Drug Use: No  . Sexual Activity: Not on file   Other Topics Concern  . Not on file   Social History Narrative   Lives in [place].      Works as a Metallurgist.     Mr. Cupo family history includes Heart attack in his father. There is no history of Hypertension or Colon cancer.      Objective:    Filed Vitals:   01/01/16 1419  BP: 124/80  Pulse: 82    Physical Exam  well-developed older white male in no acute distress, accompanied by his wife, quite pleasant blood pressure 124/80 pulse 82 height 5 foot 11 weight 203 BMI 27.7. HEENT; nontraumatic normocephalic EOMI PERRLA sclera anicteric, Cardiovascular; regular rate and rhythm with S1-S2 no murmur or gallop, Pulmonary; clear bilaterally, Abdomen ;soft, bowel sounds are present he is nontender there is no palpable mass or hepatosplenomegaly, Rectal; exam not done, Extremities ;no clubbing cyanosis or edema skin warm and dry, Neuropsych; mood and affect appropriate     Assessment & Plan:   #10 82 year old white male with recent acute or mild pancreatitis likely EtOH induced and currently completely asymptomatic. We'll also rule out cholelithiasis or microlithiasis #2 GERD #3 history of PACs  Plan; Long discussion regarding pancreatitis, and management. Will schedule for upper abdominal ultrasound He will continue a low-fat diet with alcohol avoidance over at least the next 2 weeks. He is advised that it would be best for him to not resume drinking alcohol, but if he does no more than 1 per  day. Patient will be established with Dr. Hilarie Fredrickson per patient and wife request.   Alfredia Ferguson PA-C 01/01/2016   Cc: Shon Baton, MD  Addendum: Reviewed and agree with initial management. Jerene Bears, MD

## 2016-01-01 NOTE — Patient Instructions (Signed)
Low fat diet for 2 weeks.  Information provided.  No alcohol for 2 weeks, then no more than 1 alcoholic drink per day, if at all. You will be established with Dr. Zenovia Jarred.   You have been scheduled for an abdominal ultrasound at [facility] (1st floor of hospital) on 01-08-2016 Friday at 11:30 am. Please arrive at 11:15 15 minutes prior to your appointment for registration. Make certain not to have anything to eat or drink 6 hours prior to your appointment. ( Nothing past 5:30 am in the morning.  Should you need to reschedule your appointment, please contact radiology at [phone]. This test typically takes about 30 minutes to perform.

## 2016-01-06 ENCOUNTER — Ambulatory Visit: Payer: Medicare Other | Admitting: Gastroenterology

## 2016-01-08 ENCOUNTER — Ambulatory Visit (HOSPITAL_COMMUNITY)
Admission: RE | Admit: 2016-01-08 | Discharge: 2016-01-08 | Disposition: A | Discharge status: Home / Self Care | Payer: Medicare HMO | Source: Ambulatory Visit | Attending: Physician Assistant | Admitting: Physician Assistant

## 2016-01-08 DIAGNOSIS — K859 Acute pancreatitis without necrosis or infection, unspecified: Secondary | ICD-10-CM | POA: Diagnosis not present

## 2016-01-08 DIAGNOSIS — K219 Gastro-esophageal reflux disease without esophagitis: Secondary | ICD-10-CM | POA: Insufficient documentation

## 2016-01-08 DIAGNOSIS — N281 Cyst of kidney, acquired: Secondary | ICD-10-CM | POA: Diagnosis not present

## 2016-01-19 ENCOUNTER — Encounter: Payer: Self-pay | Admitting: Family Medicine

## 2016-01-19 ENCOUNTER — Ambulatory Visit (INDEPENDENT_AMBULATORY_CARE_PROVIDER_SITE_OTHER): Payer: Medicare HMO | Admitting: Family Medicine

## 2016-01-19 ENCOUNTER — Other Ambulatory Visit: Payer: Self-pay

## 2016-01-19 VITALS — BP 118/76 | HR 76 | Wt 202.0 lb

## 2016-01-19 DIAGNOSIS — M216X9 Other acquired deformities of unspecified foot: Secondary | ICD-10-CM | POA: Diagnosis not present

## 2016-01-19 DIAGNOSIS — M2141 Flat foot [pes planus] (acquired), right foot: Secondary | ICD-10-CM | POA: Insufficient documentation

## 2016-01-19 DIAGNOSIS — M1711 Unilateral primary osteoarthritis, right knee: Secondary | ICD-10-CM | POA: Diagnosis not present

## 2016-01-19 DIAGNOSIS — M25561 Pain in right knee: Secondary | ICD-10-CM | POA: Diagnosis not present

## 2016-01-19 DIAGNOSIS — M2142 Flat foot [pes planus] (acquired), left foot: Secondary | ICD-10-CM

## 2016-01-19 NOTE — Assessment & Plan Note (Signed)
Discussed with patient about proper shoes, over-the-counter orthotics, we discussed different lacing techniques that can be beneficial. Patient will come back and see me again in 4-6 weeks is having continuing pain. May need referral to a podiatrist for custom orthotics. Believe that the custom orthotics we do would be too hard.

## 2016-01-19 NOTE — Assessment & Plan Note (Signed)
Has severe arthritis of the knee. Patient will try topical anti-inflammatory. We will avoid Tylenol secondary to his recent bout of pancreatitis. Patient's was given the option of an injection which patient declined. We also discussed possible custom bracing because instability of the knee. Patient declined this as well. We discussed proper shoes, home exercises and what activities to avoid. Patient will come back again in 4-6 weeks for further evaluation.

## 2016-01-19 NOTE — Progress Notes (Signed)
[facility] [name], [place] [address] Phone: [phone] Subjective:     CC: Right knee pain, left foot pain  [id number] Timothy Aguirre is a 82 y.o. male coming in with complaint of right knee pain. Patient states that he has a past medical history significant for a arthroscopic procedure greater than 20 years ago. Patient states his knee was doing very well until the last several months. Now having some mild instability. States that sometimes it feels weak. Patient has been trying to work out but finds it difficult. Patient rates the severity of pain as 7 out of 10 when it does occur. Does not notice any swelling. No radiation of pain. Patient is concerned because he is feeling like his balance is not as good as what it used to be.  Patient is also having some mild left foot pain. Patient states that certain shoes seem to make it more painful. Hurts more towards the toes. Does have a bunion that has been severe for quite some time. Denies any new injuries. Patient has not tried anything other than getting different shoes.     Past Medical History  Diagnosis Date  . GERD (gastroesophageal reflux disease)   . Hyperlipidemia   . BPH (benign prostatic hyperplasia)   . Back pain, chronic   . DJD (degenerative joint disease)   . Premature atrial contractions   . Hx of colonoscopy   . Colon polyps 04/19/2010    Cecum   Past Surgical History  Procedure Laterality Date  . Microdickectomy  11/1996  . Acdf  11/2009    C4-5, C5-C6, C6-7  . Redo decompression  07/30/2009  . Right knee arthroscopy      15 years ago   Social History   Social History  . Marital Status: Married    Spouse Name: Timothy Aguirre  . Number of Children: 3  . Years of Education: N/A   Occupational History  . insurance/investments    Social History Main Topics  . Smoking status: Never Smoker   . Smokeless tobacco: Never Used  . Alcohol Use: 0.0 oz/week    0 Standard  drinks or equivalent per week     Comment: drinks liquor on the weekends  . Drug Use: No  . Sexual Activity: Not Asked   Other Topics Concern  . None   Social History Narrative   Lives in [place].      Works as a Metallurgist.    Allergies  Allergen Reactions  . Cefuroxime Axetil Other (See Comments)    unknown  . Cefazolin Rash    Severe, generalized   . Vancomycin Rash    unknown   Family History  Problem Relation Age of Onset  . Heart attack Father     "enlarged heart" rheumatic fever  . Hypertension Neg Hx   . Colon cancer Neg Hx     Past medical history, social, surgical and family history all reviewed in electronic medical record.  No pertanent information unless stated regarding to the chief complaint.   Review of Systems: No headache, visual changes, nausea, vomiting, diarrhea, constipation, dizziness, abdominal pain, skin rash, fevers, chills, night sweats, weight loss, swollen lymph nodes, body aches, joint swelling, muscle aches, chest pain, shortness of breath, mood changes.   Objective Blood pressure 118/76, pulse 76, weight 202 lb (91.627 kg).  General: No apparent distress alert and oriented x3 mood and affect normal, dressed appropriately.  HEENT: Pupils equal, extraocular movements  intact  Respiratory: Patient's speak in full sentences and does not appear short of breath  Cardiovascular: No lower extremity edema, non tender, no erythema  Skin: Warm dry intact with no signs of infection or rash on extremities or on axial skeleton.  Abdomen: Soft nontender  Neuro: Cranial nerves II through XII are intact, neurovascularly intact in all extremities with 2+ DTRs and 2+ pulses.  Lymph: No lymphadenopathy of posterior or anterior cervical chain or axillae bilaterally.  Gait normal with good balance and coordination.  MSK:  Non tender with full range of motion and good stability and symmetric strength and tone of  elbows, wrist, hip, knee and ankles  bilaterally. Arthritic changes of multiple joints. Did not test shoulder again.   Knee: Right Significant valgus deformity of the knee. Mild tenderness to palpation over the medial and lateral joint lines Lacks last 2 of extension with pain on the lateral aspect of the knee Instability with valgus force Negative Mcmurray's, Apley's, and Thessalonian tests. Non painful patellar compression. Patellar glide without crepitus. Patellar and quadriceps tendons unremarkable. Hamstring and quadriceps strength is normal.  Contralateral knee fairly unremarkable with very mild crepitus. No instability  Foot exam shows the patient does have breakdown of the longitudinal arch bilaterally left couldn't and right. Significant over pronation of the hindfoot and middle portion of the feet left greater than right. Breakdown of the transverse arch bilaterally with bunionette and bunionette formation right greater than left.  MSK US performed of: Right knee This study was ordered, performed, and interpreted by Charlann Boxer D.O.  Knee: All structures visualized. Severe near bone-on-bone osteophytic changes of the medial compartment and moderate osteophytic changes of the lateral compartment. Trace effusion with narrowing of the patellofemoral joint   IMPRESSION: Severe medial compartment degenerative arthritis     Impression and Recommendations:     This case required medical decision making of moderate complexity.      Note: This dictation was prepared with Dragon dictation along with smaller phrase technology. Any transcriptional errors that result from this process are unintentional.

## 2016-01-19 NOTE — Patient Instructions (Signed)
Good to see you  Ice 20 minutes 2 times daily. Usually after activity and before bed. On the machines do not extend or flex knee fully.   Consider focusing on the eccentric aspect of the exercise.  Moving the leg back slowly.  pennsaid pinkie amount topically 2 times daily as needed.  We can always do a brace or injections Spenco orthotics "total support" online would be great the max one would be good.  Talk to primary care or look at website about coupons for cialis.  Overall you are doing well and see em when you need me or consider making an appointment in 6 weeks.

## 2016-01-26 ENCOUNTER — Ambulatory Visit: Payer: Medicare Other | Admitting: Physician Assistant

## 2016-03-01 ENCOUNTER — Ambulatory Visit: Payer: Medicare HMO | Admitting: Family Medicine

## 2016-03-22 ENCOUNTER — Ambulatory Visit (INDEPENDENT_AMBULATORY_CARE_PROVIDER_SITE_OTHER): Payer: Medicare HMO | Admitting: Family Medicine

## 2016-03-22 ENCOUNTER — Encounter: Payer: Self-pay | Admitting: Family Medicine

## 2016-03-22 DIAGNOSIS — M2142 Flat foot [pes planus] (acquired), left foot: Secondary | ICD-10-CM

## 2016-03-22 DIAGNOSIS — Z9889 Other specified postprocedural states: Secondary | ICD-10-CM

## 2016-03-22 DIAGNOSIS — M1711 Unilateral primary osteoarthritis, right knee: Secondary | ICD-10-CM

## 2016-03-22 DIAGNOSIS — M2141 Flat foot [pes planus] (acquired), right foot: Secondary | ICD-10-CM | POA: Diagnosis not present

## 2016-03-22 NOTE — Assessment & Plan Note (Signed)
Encourage him to continue with the over-the-counter orthotics. Do not think that he will respond well to room custom orthotics. Patient will continue with conservative therapy at this time.

## 2016-03-22 NOTE — Assessment & Plan Note (Signed)
Patient seems to be doing relatively well. Patient does not feel that he needs an injection at this time. Given more trial of topical anti-inflammatory's. We discussed icing regimen. If worsening symptoms we'll consider injection.

## 2016-03-22 NOTE — Progress Notes (Signed)
[name] [place] [place], [place] [address] Phone: [phone] Subjective:     CC: Right knee pain, left foot pain Follow-up  [id number]  Timothy Aguirre is a 82 y.o. male coming in with complaint of right knee pain. Patient states that he has a past medical history significant for a arthroscopic procedure greater than 20 years ago. Patient did see me in 2 months ago and was diagnosed with severe bone-on-bone osteophytic changes of the right knee. Patient elected do conservative therapy including topical anti-inflammatories, icing protocol, home exercises. Patient declined formal physical therapy and bracing. Patient states he is doing well at this time. States that the topical anti-inflammatories and icing has been helpful. No significant instability at the time. Patient feels that he has made progress. His lungs he does he exercises she seems to do well. Patient once again continue with the conservative therapy thinks.  Patient is also having some mild left foot pain. Seems better since he has been doing the over-the-counter orthotics. Denies any new symptoms.     Past Medical History:  Diagnosis Date  . Back pain, chronic   . BPH (benign prostatic hyperplasia)   . Colon polyps 04/19/2010   Cecum  . DJD (degenerative joint disease)   . GERD (gastroesophageal reflux disease)   . Hx of colonoscopy   . Hyperlipidemia   . Premature atrial contractions    Past Surgical History:  Procedure Laterality Date  . ACDF  11/2009   C4-5, C5-C6, C6-7  . microdickectomy  11/1996  . redo decompression  07/30/2009  . right knee arthroscopy     15 years ago   Social History   Social History  . Marital status: Married    Spouse name: Vicky  . Number of children: 3  . Years of education: N/A   Occupational History  . insurance/investments    Social History Main Topics  . Smoking status: Never Smoker  . Smokeless tobacco: Never Used  . Alcohol  use 0.0 oz/week     Comment: drinks liquor on the weekends  . Drug use: No  . Sexual activity: Not Asked   Other Topics Concern  . None   Social History Narrative   Lives in [place].      Works as a Metallurgist.    Allergies  Allergen Reactions  . Cefuroxime Axetil Other (See Comments)    unknown  . Cefazolin Rash    Severe, generalized   . Vancomycin Rash    unknown   Family History  Problem Relation Age of Onset  . Heart attack Father     "enlarged heart" rheumatic fever  . Hypertension Neg Hx   . Colon cancer Neg Hx     Past medical history, social, surgical and family history all reviewed in electronic medical record.  No pertanent information unless stated regarding to the chief complaint.   Review of Systems: No headache, visual changes, nausea, vomiting, diarrhea, constipation, dizziness, abdominal pain, skin rash, fevers, chills, night sweats, weight loss, swollen lymph nodes, chest pain, shortness of breath, mood changes.   Objective  Blood pressure 124/70, pulse 80, weight 204 lb (92.5 kg), SpO2 97 %.  General: No apparent distress alert and oriented x3 mood and affect normal, dressed appropriately.  HEENT: Pupils equal, extraocular movements intact  Respiratory: Patient's speak in full sentences and does not appear short of breath  Cardiovascular: No lower extremity edema, non tender, no erythema  Skin: Warm dry intact  with no signs of infection or rash on extremities or on axial skeleton.  Abdomen: Soft nontender  Neuro: Cranial nerves II through XII are intact, neurovascularly intact in all extremities with 2+ DTRs and 2+ pulses.  Lymph: No lymphadenopathy of posterior or anterior cervical chain or axillae bilaterally.  Gait normal with good balance and coordination.  MSK:  Non tender with full range of motion and good stability and symmetric strength and tone of  elbows, wrist, hip, knee and ankles bilaterally. Arthritic changes of multiple  joints. Did not test shoulder again.   Knee: Right valgus deformity of the knee. Minimal tenderness over the medial joint line Full range of motion Instability with valgus force Negative Mcmurray's, Apley's, and Thessalonian tests. Non painful patellar compression. Patellar glide without crepitus. Patellar and quadriceps tendons unremarkable. Hamstring and quadriceps strength is normal.  Contralateral knee fairly unremarkable with very mild crepitus. No instability  Foot exam shows the patient does have breakdown of the longitudinal arch bilaterally left couldn't and right. Significant over pronation of the hindfoot and middle portion of the feet left greater than right. Breakdown of the transverse arch bilaterally with bunionette and bunionette formation right greater than left.      Impression and Recommendations:     This case required medical decision making of moderate complexity.      Note: This dictation was prepared with Dragon dictation along with smaller phrase technology. Any transcriptional errors that result from this process are unintentional.

## 2016-03-22 NOTE — Patient Instructions (Signed)
Good to see you  Got you a handicap form pennsaid pinkie amount topically 2 times daily as needed.  Ice is your friend Keep moving the shoulder I am fine with the orthotics you have in your shoes You will do great on your trip See me again if your knee acts up!

## 2016-03-22 NOTE — Assessment & Plan Note (Signed)
Has had difficulty with this before. Patient had a large rotator cuff tear and was not able to ever get full range of motion back. Patient would need a shoulder replacement. Patient once to avoid that.

## 2016-04-15 DIAGNOSIS — G245 Blepharospasm: Secondary | ICD-10-CM | POA: Diagnosis not present

## 2016-04-18 DIAGNOSIS — E784 Other hyperlipidemia: Secondary | ICD-10-CM | POA: Diagnosis not present

## 2016-04-18 DIAGNOSIS — R7301 Impaired fasting glucose: Secondary | ICD-10-CM | POA: Diagnosis not present

## 2016-04-18 DIAGNOSIS — Z125 Encounter for screening for malignant neoplasm of prostate: Secondary | ICD-10-CM | POA: Diagnosis not present

## 2016-04-25 DIAGNOSIS — I700 Atherosclerosis of aorta: Secondary | ICD-10-CM | POA: Diagnosis not present

## 2016-04-25 DIAGNOSIS — R002 Palpitations: Secondary | ICD-10-CM | POA: Diagnosis not present

## 2016-04-25 DIAGNOSIS — R413 Other amnesia: Secondary | ICD-10-CM | POA: Diagnosis not present

## 2016-04-25 DIAGNOSIS — Z6828 Body mass index (BMI) 28.0-28.9, adult: Secondary | ICD-10-CM | POA: Diagnosis not present

## 2016-04-25 DIAGNOSIS — Z0000 Encounter for general adult medical examination without abnormal findings: Secondary | ICD-10-CM | POA: Diagnosis not present

## 2016-04-25 DIAGNOSIS — N183 Chronic kidney disease, stage 3 (moderate): Secondary | ICD-10-CM | POA: Diagnosis not present

## 2016-04-25 DIAGNOSIS — R7301 Impaired fasting glucose: Secondary | ICD-10-CM | POA: Diagnosis not present

## 2016-04-25 DIAGNOSIS — E784 Other hyperlipidemia: Secondary | ICD-10-CM | POA: Diagnosis not present

## 2016-04-25 DIAGNOSIS — G4733 Obstructive sleep apnea (adult) (pediatric): Secondary | ICD-10-CM | POA: Diagnosis not present

## 2016-04-25 DIAGNOSIS — R159 Full incontinence of feces: Secondary | ICD-10-CM | POA: Diagnosis not present

## 2016-05-06 DIAGNOSIS — Z1212 Encounter for screening for malignant neoplasm of rectum: Secondary | ICD-10-CM | POA: Diagnosis not present

## 2016-05-24 ENCOUNTER — Telehealth: Payer: Self-pay | Admitting: Internal Medicine

## 2016-05-24 NOTE — Telephone Encounter (Signed)
Made pt aware I will leaves samples at the front desk for him to come by & pick up.

## 2016-05-24 NOTE — Telephone Encounter (Signed)
lmovm for pt to return call.  

## 2016-05-24 NOTE — Telephone Encounter (Signed)
Pt called in and would like more samples of pensaid or a script for it  Best number [phone]

## 2016-06-07 DIAGNOSIS — G245 Blepharospasm: Secondary | ICD-10-CM | POA: Diagnosis not present

## 2016-06-28 DIAGNOSIS — J019 Acute sinusitis, unspecified: Secondary | ICD-10-CM | POA: Diagnosis not present

## 2016-06-28 DIAGNOSIS — Z6828 Body mass index (BMI) 28.0-28.9, adult: Secondary | ICD-10-CM | POA: Diagnosis not present

## 2016-08-02 DIAGNOSIS — N5201 Erectile dysfunction due to arterial insufficiency: Secondary | ICD-10-CM | POA: Diagnosis not present

## 2016-08-02 DIAGNOSIS — R350 Frequency of micturition: Secondary | ICD-10-CM | POA: Diagnosis not present

## 2016-08-02 DIAGNOSIS — N401 Enlarged prostate with lower urinary tract symptoms: Secondary | ICD-10-CM | POA: Diagnosis not present

## 2016-08-18 DIAGNOSIS — L918 Other hypertrophic disorders of the skin: Secondary | ICD-10-CM | POA: Diagnosis not present

## 2016-08-18 DIAGNOSIS — L821 Other seborrheic keratosis: Secondary | ICD-10-CM | POA: Diagnosis not present

## 2016-08-18 DIAGNOSIS — Z85828 Personal history of other malignant neoplasm of skin: Secondary | ICD-10-CM | POA: Diagnosis not present

## 2016-08-18 DIAGNOSIS — Z85820 Personal history of malignant melanoma of skin: Secondary | ICD-10-CM | POA: Diagnosis not present

## 2016-08-18 DIAGNOSIS — L853 Xerosis cutis: Secondary | ICD-10-CM | POA: Diagnosis not present

## 2016-08-18 DIAGNOSIS — D1801 Hemangioma of skin and subcutaneous tissue: Secondary | ICD-10-CM | POA: Diagnosis not present

## 2016-08-18 DIAGNOSIS — L812 Freckles: Secondary | ICD-10-CM | POA: Diagnosis not present

## 2016-08-18 DIAGNOSIS — D225 Melanocytic nevi of trunk: Secondary | ICD-10-CM | POA: Diagnosis not present

## 2016-08-18 DIAGNOSIS — D2272 Melanocytic nevi of left lower limb, including hip: Secondary | ICD-10-CM | POA: Diagnosis not present

## 2016-10-04 DIAGNOSIS — G245 Blepharospasm: Secondary | ICD-10-CM | POA: Diagnosis not present

## 2016-11-03 DIAGNOSIS — R69 Illness, unspecified: Secondary | ICD-10-CM | POA: Diagnosis not present

## 2016-11-03 DIAGNOSIS — N183 Chronic kidney disease, stage 3 (moderate): Secondary | ICD-10-CM | POA: Diagnosis not present

## 2016-11-03 DIAGNOSIS — R159 Full incontinence of feces: Secondary | ICD-10-CM | POA: Diagnosis not present

## 2016-11-03 DIAGNOSIS — I700 Atherosclerosis of aorta: Secondary | ICD-10-CM | POA: Diagnosis not present

## 2016-11-03 DIAGNOSIS — R413 Other amnesia: Secondary | ICD-10-CM | POA: Diagnosis not present

## 2016-11-03 DIAGNOSIS — Z6828 Body mass index (BMI) 28.0-28.9, adult: Secondary | ICD-10-CM | POA: Diagnosis not present

## 2016-11-03 DIAGNOSIS — R7301 Impaired fasting glucose: Secondary | ICD-10-CM | POA: Diagnosis not present

## 2016-11-03 DIAGNOSIS — G250 Essential tremor: Secondary | ICD-10-CM | POA: Diagnosis not present

## 2016-11-21 DIAGNOSIS — Z6828 Body mass index (BMI) 28.0-28.9, adult: Secondary | ICD-10-CM | POA: Diagnosis not present

## 2016-11-21 DIAGNOSIS — J069 Acute upper respiratory infection, unspecified: Secondary | ICD-10-CM | POA: Diagnosis not present

## 2016-12-31 DIAGNOSIS — Z6827 Body mass index (BMI) 27.0-27.9, adult: Secondary | ICD-10-CM | POA: Diagnosis not present

## 2016-12-31 DIAGNOSIS — T783XXA Angioneurotic edema, initial encounter: Secondary | ICD-10-CM | POA: Diagnosis not present

## 2016-12-31 DIAGNOSIS — L509 Urticaria, unspecified: Secondary | ICD-10-CM | POA: Diagnosis not present

## 2017-01-24 DIAGNOSIS — G245 Blepharospasm: Secondary | ICD-10-CM | POA: Diagnosis not present

## 2017-02-20 ENCOUNTER — Telehealth: Payer: Self-pay | Admitting: Family Medicine

## 2017-02-20 NOTE — Telephone Encounter (Signed)
Spoke to pt, scheduled him 8.22.18 @ 11:15am.

## 2017-02-20 NOTE — Telephone Encounter (Signed)
We could see him before Sept 1st Lets see if we have cancellation in the morning for the next week or so

## 2017-02-20 NOTE — Telephone Encounter (Signed)
Patient states he is having hip pain.  States he has spoken with Dr. Tamala Julian about possibly examining and treating hip issue.  States he has been using pennsaid on the hip area for the last few days but is not too sure yet if it is helping.  Patient is going to be leaving for a trip on September 1st and will be doing a lot of walking.  I have offered to get patient in with Dr. Raeford Razor.  Patient would prefer to see Dr. Tamala Julian and is requesting to be worked in.  Please follow up with patient in regard.

## 2017-02-22 ENCOUNTER — Ambulatory Visit: Payer: Self-pay

## 2017-02-22 ENCOUNTER — Ambulatory Visit (INDEPENDENT_AMBULATORY_CARE_PROVIDER_SITE_OTHER): Payer: Medicare HMO | Admitting: Family Medicine

## 2017-02-22 ENCOUNTER — Encounter: Payer: Self-pay | Admitting: Family Medicine

## 2017-02-22 VITALS — BP 132/80 | HR 62 | Wt 205.0 lb

## 2017-02-22 DIAGNOSIS — M25551 Pain in right hip: Secondary | ICD-10-CM

## 2017-02-22 DIAGNOSIS — M7061 Trochanteric bursitis, right hip: Secondary | ICD-10-CM | POA: Insufficient documentation

## 2017-02-22 MED ORDER — PREDNISONE 20 MG PO TABS: 40.0000 mg | ORAL_TABLET | Freq: Every day | ORAL | 0 refills | Status: AC

## 2017-02-22 NOTE — Progress Notes (Signed)
[name] [address] [place], [place] [address] Phone: [phone] Subjective:    I'm seeing this patient by the request  of:    CC: Right  [id number]  Timothy Aguirre is a 83 y.o. male coming in with complaint of right hip pain. He has been having pain for a couple of months. He has been using Pennsaid on the glute and hip area. Stairs reproduce the pain.   Onset- 2 months Location- right hip Duration- intermittent Character-achy  Aggravating factors- stairs, seated positions  Reliving factors- none Therapies tried- stretching  Severity- 1/10    Past Medical History:  Diagnosis Date  . Back pain, chronic   . BPH (benign prostatic hyperplasia)   . Colon polyps 04/19/2010   Cecum  . DJD (degenerative joint disease)   . GERD (gastroesophageal reflux disease)   . Hx of colonoscopy   . Hyperlipidemia   . Premature atrial contractions    Past Surgical History:  Procedure Laterality Date  . ACDF  11/2009   C4-5, C5-C6, C6-7  . microdickectomy  11/1996  . redo decompression  07/30/2009  . right knee arthroscopy     15 years ago   Social History   Social History  . Marital status: Married    Spouse name: Vicky  . Number of children: 3  . Years of education: N/A   Occupational History  . insurance/investments    Social History Main Topics  . Smoking status: Never Smoker  . Smokeless tobacco: Never Used  . Alcohol use 0.0 oz/week     Comment: drinks liquor on the weekends  . Drug use: No  . Sexual activity: Not Asked   Other Topics Concern  . None   Social History Narrative   Lives in [place].      Works as a Metallurgist.    Allergies  Allergen Reactions  . Cefuroxime Axetil Other (See Comments)    unknown  . Cefazolin Rash    Severe, generalized   . Vancomycin Rash    unknown   Family History  Problem Relation Age of Onset  . Heart attack Father        "enlarged heart" rheumatic fever  .  Hypertension Neg Hx   . Colon cancer Neg Hx      Past medical history, social, surgical and family history all reviewed in electronic medical record.  No pertanent information unless stated regarding to the chief complaint.   Review of Systems:Review of systems updated and as accurate as of 02/22/17  No headache, visual changes, nausea, vomiting, diarrhea, constipation, dizziness, abdominal pain, skin rash, fevers, chills, night sweats, weight loss, swollen lymph nodes, body aches, joint swelling, chest pain, shortness of breath, mood changes. Positive muscle aches  Objective  Blood pressure 132/80, pulse 62, weight 205 lb (93 kg). Systems examined below as of 02/22/17   General: No apparent distress alert and oriented x3 mood and affect normal, dressed appropriately.  HEENT: Pupils equal, extraocular movements intact  Respiratory: Patient's speak in full sentences and does not appear short of breath  Cardiovascular: No lower extremity edema, non tender, no erythema  Skin: Warm dry intact with no signs of infection or rash on extremities or on axial skeleton.  Abdomen: Soft nontender  Neuro: Cranial nerves II through XII are intact, neurovascularly intact in all extremities with 2+ DTRs and 2+ pulses.  Lymph: No lymphadenopathy of posterior or anterior cervical chain or axillae bilaterally.  Gait normal with  good balance and coordination.  MSK:  Non tender with full range of motion and good stability and symmetric strength and tone of shoulders, elbows, wrist, knee and ankles bilaterally.  Right hip shows over the greater trochanteric bursa. Patient is tender to the lateral aspect. Negative pain with internal fixation. Negative straight leg test. Full range of motion and full strength   Procedure: Real-time Ultrasound Guided Injection of right greater trochanteric bursitis secondary to patient's body habitus Device: GE Logiq Q7 Ultrasound guided injection is preferred based studies that  show increased duration, increased effect, greater accuracy, decreased procedural pain, increased response rate, and decreased cost with ultrasound guided versus blind injection.  Verbal informed consent obtained.  Time-out conducted.  Noted no overlying erythema, induration, or other signs of local infection.  Skin prepped in a sterile fashion.  Local anesthesia: Topical Ethyl chloride.  With sterile technique and under real time ultrasound guidance:  Greater trochanteric area was visualized and patient's bursa was noted. A 22-gauge 3 inch needle was inserted and 4 cc of 0.5% Marcaine and 1 cc of Kenalog 40 mg/dL was injected. Pictures taken Completed without difficulty  Pain immediately resolved suggesting accurate placement of the medication.  Advised to call if fevers/chills, erythema, induration, drainage, or persistent bleeding.  Images permanently stored and available for review in the ultrasound unit.  Impression: Technically successful ultrasound guided injection.    Impression and Recommendations:     This case required medical decision making of moderate complexity.      Note: This dictation was prepared with Dragon dictation along with smaller phrase technology. Any transcriptional errors that result from this process are unintentional.

## 2017-02-22 NOTE — Patient Instructions (Signed)
Good to see you  Timothy Aguirre is your friend. Ice 20 minutes 2 times daily. Usually after activity and before bed. pennsaid pinkie amount topically 2 times daily as needed.  Exercises 3 times a week.  If more pain on the trip prednisone 40 mg daily for 5 days.  Have a great time When back do bike 2 times a week and treadmill 2 times a week See me again in 4 weeks.

## 2017-02-22 NOTE — Assessment & Plan Note (Signed)
New problem. Given an injection. Prednisone given encase patient has worsening symptoms. We discussed topical anti-inflammatories. Patient will be traveling he'll come back when he returns in 4 weeks.

## 2017-03-27 ENCOUNTER — Ambulatory Visit (INDEPENDENT_AMBULATORY_CARE_PROVIDER_SITE_OTHER)
Admission: RE | Admit: 2017-03-27 | Discharge: 2017-03-27 | Disposition: A | Discharge status: Home / Self Care | Payer: Medicare HMO | Source: Ambulatory Visit | Attending: Family Medicine | Admitting: Family Medicine

## 2017-03-27 ENCOUNTER — Ambulatory Visit (INDEPENDENT_AMBULATORY_CARE_PROVIDER_SITE_OTHER): Payer: Medicare HMO | Admitting: Family Medicine

## 2017-03-27 ENCOUNTER — Encounter: Payer: Self-pay | Admitting: Family Medicine

## 2017-03-27 VITALS — BP 130/80 | HR 87 | Ht 72.0 in | Wt 199.0 lb

## 2017-03-27 DIAGNOSIS — M545 Low back pain: Secondary | ICD-10-CM | POA: Diagnosis not present

## 2017-03-27 DIAGNOSIS — G8929 Other chronic pain: Secondary | ICD-10-CM

## 2017-03-27 DIAGNOSIS — M5441 Lumbago with sciatica, right side: Secondary | ICD-10-CM

## 2017-03-27 DIAGNOSIS — M5416 Radiculopathy, lumbar region: Secondary | ICD-10-CM

## 2017-03-27 MED ORDER — GABAPENTIN 100 MG PO CAPS: 200.0000 mg | ORAL_CAPSULE | Freq: Every day | ORAL | 3 refills | Status: DC

## 2017-03-27 NOTE — Assessment & Plan Note (Signed)
Pain seems to be more secondary to a lumbar radiculopathy. Discussed with patient at great length. We discussed icing regimen, home exercises, patient started on gabapentin. We discussed and follow-up will be necessary for advance imaging. X-rays ordered today though.

## 2017-03-27 NOTE — Progress Notes (Signed)
[facility] [address] [place], [place] [address] Phone: [phone] Subjective:    I'm seeing this patient by the request  of:    CC:   [id number]  Timothy Aguirre is a 83 y.o. male coming in with complaint of hip pain. Says his hip still bothers him.   Onset-  Location Duration-  Character- Aggravating factors- Reliving factors-  Therapies tried-  Severity-     Past Medical History:  Diagnosis Date  . Back pain, chronic   . BPH (benign prostatic hyperplasia)   . Colon polyps 04/19/2010   Cecum  . DJD (degenerative joint disease)   . GERD (gastroesophageal reflux disease)   . Hx of colonoscopy   . Hyperlipidemia   . Premature atrial contractions    Past Surgical History:  Procedure Laterality Date  . ACDF  11/2009   C4-5, C5-C6, C6-7  . microdickectomy  11/1996  . redo decompression  07/30/2009  . right knee arthroscopy     15 years ago   Social History   Social History  . Marital status: Married    Spouse name: Timothy Aguirre  . Number of children: 3  . Years of education: N/A   Occupational History  . insurance/investments    Social History Main Topics  . Smoking status: Never Smoker  . Smokeless tobacco: Never Used  . Alcohol use 0.0 oz/week     Comment: drinks liquor on the weekends  . Drug use: No  . Sexual activity: Not on file   Other Topics Concern  . Not on file   Social History Narrative   Lives in [place].      Works as a Metallurgist.    Allergies  Allergen Reactions  . Cefuroxime Axetil Other (See Comments)    unknown  . Cefazolin Rash    Severe, generalized   . Vancomycin Rash    unknown   Family History  Problem Relation Age of Onset  . Heart attack Father        "enlarged heart" rheumatic fever  . Hypertension Neg Hx   . Colon cancer Neg Hx      Past medical history, social, surgical and family history all reviewed in electronic medical record.  No pertanent information  unless stated regarding to the chief complaint.   Review of Systems:Review of systems updated and as accurate as of 03/27/17  No headache, visual changes, nausea, vomiting, diarrhea, constipation, dizziness, abdominal pain, skin rash, fevers, chills, night sweats, weight loss, swollen lymph nodes, body aches, joint swelling, muscle aches, chest pain, shortness of breath, mood changes.   Objective  There were no vitals taken for this visit. Systems examined below as of 03/27/17   General: No apparent distress alert and oriented x3 mood and affect normal, dressed appropriately.  HEENT: Pupils equal, extraocular movements intact  Respiratory: Patient's speak in full sentences and does not appear short of breath  Cardiovascular: No lower extremity edema, non tender, no erythema  Skin: Warm dry intact with no signs of infection or rash on extremities or on axial skeleton.  Abdomen: Soft nontender  Neuro: Cranial nerves II through XII are intact, neurovascularly intact in all extremities with 2+ DTRs and 2+ pulses.  Lymph: No lymphadenopathy of posterior or anterior cervical chain or axillae bilaterally.  Gait normal with good balance and coordination.  MSK:  Non tender with full range of motion and good stability and symmetric strength and tone of shoulders, elbows, wrist, hip, knee  and ankles bilaterally.     Impression and Recommendations:     This case required medical decision making of moderate complexity.      Note: This dictation was prepared with Dragon dictation along with smaller phrase technology. Any transcriptional errors that result from this process are unintentional.

## 2017-03-27 NOTE — Patient Instructions (Signed)
Good to see you  Ice is your friend Xray downstairs Gabapentin 200mg  at night [phone] call and I will see what we can do for your better half Keep being active See me again in 3 weeks and we will see if we need mor eon the back or physical therapy

## 2017-03-27 NOTE — Progress Notes (Signed)
[name] [place], [place] [address] Phone: [phone] Subjective:    I'm seeing this patient by the request  of:    CC: Right hip pain  [id number]  Timothy Aguirre is a 83 y.o. male coming in with complaint of Right hip pain patient had what appeared to be more of a greater trochanteric injection given to him on 02/22/2017. Patient states that unfortunately did not notice any significant improvement. Past medical history significant for back surgery. States that the pain still seems to be mostly on the lateral aspect the hip. Hasn't found anything except for a little bit of anti-inflammatories topically that seems to be somewhat helpful. Mild radiation any as numbness down the leg. Denies any weakness. States that going up or down stairs can be more difficult.    past medical history significant for a L3 L5 laminectomy and fusion with seen on MRI in 2011. Patient also had an severe facet degenerative disease at L5-S1.  Past Medical History:  Diagnosis Date  . Back pain, chronic   . BPH (benign prostatic hyperplasia)   . Colon polyps 04/19/2010   Cecum  . DJD (degenerative joint disease)   . GERD (gastroesophageal reflux disease)   . Hx of colonoscopy   . Hyperlipidemia   . Premature atrial contractions    Past Surgical History:  Procedure Laterality Date  . ACDF  11/2009   C4-5, C5-C6, C6-7  . microdickectomy  11/1996  . redo decompression  07/30/2009  . right knee arthroscopy     15 years ago   Social History   Social History  . Marital status: Married    Spouse name: Vicky  . Number of children: 3  . Years of education: N/A   Occupational History  . insurance/investments    Social History Main Topics  . Smoking status: Never Smoker  . Smokeless tobacco: Never Used  . Alcohol use 0.0 oz/week     Comment: drinks liquor on the weekends  . Drug use: No  . Sexual activity: Not on file   Other Topics Concern  .  Not on file   Social History Narrative   Lives in [place].      Works as a Metallurgist.    Allergies  Allergen Reactions  . Cefuroxime Axetil Other (See Comments)    unknown  . Cefazolin Rash    Severe, generalized   . Vancomycin Rash    unknown   Family History  Problem Relation Age of Onset  . Heart attack Father        "enlarged heart" rheumatic fever  . Hypertension Neg Hx   . Colon cancer Neg Hx      Past medical history, social, surgical and family history all reviewed in electronic medical record.  No pertanent information unless stated regarding to the chief complaint.   Review of Systems:Review of systems updated and as accurate as of 03/27/17  No headache, visual changes, nausea, vomiting, diarrhea, constipation, dizziness, abdominal pain, skin rash, fevers, chills, night sweats, weight loss, swollen lymph nodes, body aches, joint swelling, muscle aches, chest pain, shortness of breath, mood changes.   Objective  There were no vitals taken for this visit. Systems examined below as of 03/27/17   General: No apparent distress alert and oriented x3 mood and affect normal, dressed appropriately.  HEENT: Pupils equal, extraocular movements intact  Respiratory: Patient's speak in full sentences and does not appear short of breath  Cardiovascular:  No lower extremity edema, non tender, no erythema  Skin: Warm dry intact with no signs of infection or rash on extremities or on axial skeleton.  Abdomen: Soft nontender  Neuro: Cranial nerves II through XII are intact, neurovascularly intact in all extremities with 2+ DTRs and 2+ pulses.  Lymph: No lymphadenopathy of posterior or anterior cervical chain or axillae bilaterally.  Gait Mild antalgic gait MSK:  Non tender with full range of motion and good stability and symmetric strength and tone of shoulders, elbows, wrist,  knee and ankles bilaterally.  Hip exam shows the patient is minimally tender over the  greater trochanteric area but has full range of motion. Mild positive straight leg test. More pain to tenderness to palpation in the L4-L5 area on the right side of the lumbar spine. Mild pain over the right sacroiliac joint. Mild pain in the piriformis.   Impression and Recommendations:     This case required medical decision making of moderate complexity.      Note: This dictation was prepared with Dragon dictation along with smaller phrase technology. Any transcriptional errors that result from this process are unintentional.

## 2017-04-03 DIAGNOSIS — R69 Illness, unspecified: Secondary | ICD-10-CM | POA: Diagnosis not present

## 2017-04-18 ENCOUNTER — Ambulatory Visit (INDEPENDENT_AMBULATORY_CARE_PROVIDER_SITE_OTHER): Payer: Medicare HMO | Admitting: Family Medicine

## 2017-04-18 ENCOUNTER — Encounter: Payer: Self-pay | Admitting: Family Medicine

## 2017-04-18 DIAGNOSIS — M5416 Radiculopathy, lumbar region: Secondary | ICD-10-CM | POA: Diagnosis not present

## 2017-04-18 NOTE — Progress Notes (Signed)
[facility] [address], [place] [address] Phone: [phone] Subjective:    I'm seeing this patient by the request  of:    CC: Right hip and neck pain follow-up  [id number]  Timothy Aguirre is a 83 y.o. male coming in with complaint of right hip and back pain. Patient did not respond well to greater trochanteric injections and started being treated for the back. Patient was started on gabapentin. Started icing regimen. X-ray showed some progression of patient's arthritis in the back. He is still having pain in his back and hip but no better or worse than last visit.     patient did have x-rays taken 03/28/2017. X-rays were independently visualized by me showing a posterior fusion from L3-L5 no significant loosening. Patient unfortunately has had some worsening of the degenerative disc disease and facet disease throughout the lumbar spine.  Past Medical History:  Diagnosis Date  . Back pain, chronic   . BPH (benign prostatic hyperplasia)   . Colon polyps 04/19/2010   Cecum  . DJD (degenerative joint disease)   . GERD (gastroesophageal reflux disease)   . Hx of colonoscopy   . Hyperlipidemia   . Premature atrial contractions    Past Surgical History:  Procedure Laterality Date  . ACDF  11/2009   C4-5, C5-C6, C6-7  . microdickectomy  11/1996  . redo decompression  07/30/2009  . right knee arthroscopy     15 years ago   Social History   Social History  . Marital status: Married    Spouse name: Vicky  . Number of children: 3  . Years of education: N/A   Occupational History  . insurance/investments    Social History Main Topics  . Smoking status: Never Smoker  . Smokeless tobacco: Never Used  . Alcohol use 0.0 oz/week     Comment: drinks liquor on the weekends  . Drug use: No  . Sexual activity: Not Asked   Other Topics Concern  . None   Social History Narrative   Lives in [place].      Works as a Hotel manager.    Allergies  Allergen Reactions  . Cefuroxime Axetil Other (See Comments)    unknown  . Cefazolin Rash    Severe, generalized   . Vancomycin Rash    unknown   Family History  Problem Relation Age of Onset  . Heart attack Father        "enlarged heart" rheumatic fever  . Hypertension Neg Hx   . Colon cancer Neg Hx      Past medical history, social, surgical and family history all reviewed in electronic medical record.  No pertanent information unless stated regarding to the chief complaint.   Review of Systems:Review of systems updated and as accurate as of 04/18/17  No headache, visual changes, nausea, vomiting, diarrhea, constipation, dizziness, abdominal pain, skin rash, fevers, chills, night sweats, weight loss, swollen lymph nodes, body aches, joint swelling, chest pain, shortness of breath, mood changes. Positive muscle aches  Objective  Blood pressure 120/78, pulse 72, height 5' 11.5" (1.816 m), weight 208 lb (94.3 kg), SpO2 98 %. Systems examined below as of 04/18/17   General: No apparent distress alert and oriented x3 mood and affect normal, dressed appropriately.  HEENT: Pupils equal, extraocular movements intact  Respiratory: Patient's speak in full sentences and does not appear short of breath  Cardiovascular: No lower extremity edema, non tender, no erythema  Skin: Warm  dry intact with no signs of infection or rash on extremities or on axial skeleton.  Abdomen: Soft nontender  Neuro: Cranial nerves II through XII are intact, neurovascularly intact in all extremities with 2+ DTRs and 2+ pulses.  Lymph: No lymphadenopathy of posterior or anterior cervical chain or axillae bilaterally.  Gait normal with good balance and coordination.  MSK:  Non tender with full range of motion and good stability and symmetric strength and tone of shoulders, elbows, wrist, hip, knee and ankles bilaterally. Arthritic changes of multiple joints. Significant decreased range  of motion of the right shoulder  Back Exam:  Inspection: Degenerative scoliosis noted Motion: Flexion 45 deg, Extension 25 deg, Side Bending to 35 deg bilaterally,  Rotation to 35 deg bilaterally  SLR laying: Negative tightness no bilaterally of the hamstrings XSLR laying: Negative  Palpable tenderness: Tender to palpation in the paraspinal musculature lumbar spine. FABER: negative. Sensory change: Gross sensation intact to all lumbar and sacral dermatomes.  Reflexes: 2+ at both patellar tendons, 2+ at achilles tendons, Babinski's downgoing.  Strength at foot  Plantar-flexion: 5/5 Dorsi-flexion: 5/5 Eversion: 5/5 Inversion: 5/5  Leg strength  Quad: 5/5 Hamstring: 5/5 Hip flexor: 5/5 Hip abductors: 4/5 but symmetric Gait unremarkable.     Impression and Recommendations:     This case required medical decision making of moderate complexity.      Note: This dictation was prepared with Dragon dictation along with smaller phrase technology. Any transcriptional errors that result from this process are unintentional.

## 2017-04-18 NOTE — Assessment & Plan Note (Signed)
Patient's x-ray show the patient does have some progression of the arthritic changes. 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 This is likely causing more of an L3 nerve root impingement. Encourage patient to try to go up on the gabapentin to 300 mg with mild improvement that he has had previously. We discussed the potential side effects. Discussed going to formal physical therapy which patient declined. We discussed and answered imaging such as a CT myelogram for further evaluation of patient's back when she also has declined at this time but is going to consider in the long run. We discussed other medication choices including prednisone for intermittent pain. Patient wants to avoid anything that can possibly make concentration more difficult. Following up with me again in 2 months.

## 2017-04-18 NOTE — Patient Instructions (Signed)
Good to see you  Try gabapentin 300mg  at night and tell me in a week if we want ot continue that dose or the 200 [employer] orthotics "total support" online would be great  Keep doing the exercises and consider the physical therapy  Ice when you need it You are doing great  See me again in 2 months.

## 2017-04-24 ENCOUNTER — Telehealth: Payer: Self-pay | Admitting: Family Medicine

## 2017-04-24 DIAGNOSIS — M5416 Radiculopathy, lumbar region: Secondary | ICD-10-CM

## 2017-04-24 NOTE — Telephone Encounter (Signed)
Referral entered & faxed to [facility].

## 2017-04-24 NOTE — Telephone Encounter (Signed)
Pt called stating he was supposed to notify Tamala Julian if 300mg  of gabapentin was working and he does not see any difference than when he was taking 200mg .  Pt believes it is time to be seen by a surgeon and would like to be referred to Dr. Vertell Limber.  Please advise and call back in regard,

## 2017-05-02 ENCOUNTER — Telehealth: Payer: Self-pay | Admitting: Family Medicine

## 2017-05-02 NOTE — Telephone Encounter (Signed)
Can but too expensive usually  If he needs samples we can leave some upfront

## 2017-05-02 NOTE — Telephone Encounter (Signed)
Patient states that he cut back his gabapentin from 300 to 200. Patient states he does not feel as groggy and tired.  Wants to Thank for referring to Dr. Vertell Limber and does appreciate the care that Dr. Tamala Julian has given him.  Would like to know if Dr. Tamala Julian could prescribe Pennsaid for his hip.

## 2017-05-03 ENCOUNTER — Other Ambulatory Visit: Payer: Self-pay | Admitting: *Deleted

## 2017-05-03 MED ORDER — GABAPENTIN 100 MG PO CAPS: 200.0000 mg | ORAL_CAPSULE | Freq: Every day | ORAL | 1 refills | Status: AC

## 2017-05-03 NOTE — Telephone Encounter (Signed)
lmovm making pt aware that samples are at front desk ready to be picked up.

## 2017-05-03 NOTE — Telephone Encounter (Signed)
Spoke to pt, he requested a refill of gabapentin 200mg  sent into pharmacy.  Refill done.

## 2017-05-04 DIAGNOSIS — Z23 Encounter for immunization: Secondary | ICD-10-CM | POA: Diagnosis not present

## 2017-05-09 DIAGNOSIS — N183 Chronic kidney disease, stage 3 (moderate): Secondary | ICD-10-CM | POA: Diagnosis not present

## 2017-05-09 DIAGNOSIS — I700 Atherosclerosis of aorta: Secondary | ICD-10-CM | POA: Diagnosis not present

## 2017-05-09 DIAGNOSIS — M1990 Unspecified osteoarthritis, unspecified site: Secondary | ICD-10-CM | POA: Diagnosis not present

## 2017-05-09 DIAGNOSIS — Z6828 Body mass index (BMI) 28.0-28.9, adult: Secondary | ICD-10-CM | POA: Diagnosis not present

## 2017-05-09 DIAGNOSIS — R69 Illness, unspecified: Secondary | ICD-10-CM | POA: Diagnosis not present

## 2017-05-09 DIAGNOSIS — H9190 Unspecified hearing loss, unspecified ear: Secondary | ICD-10-CM | POA: Diagnosis not present

## 2017-05-09 DIAGNOSIS — M2010 Hallux valgus (acquired), unspecified foot: Secondary | ICD-10-CM | POA: Diagnosis not present

## 2017-05-09 DIAGNOSIS — R7301 Impaired fasting glucose: Secondary | ICD-10-CM | POA: Diagnosis not present

## 2017-05-16 DIAGNOSIS — R1031 Right lower quadrant pain: Secondary | ICD-10-CM | POA: Diagnosis not present

## 2017-05-16 DIAGNOSIS — Z6828 Body mass index (BMI) 28.0-28.9, adult: Secondary | ICD-10-CM | POA: Diagnosis not present

## 2017-05-24 DIAGNOSIS — M47816 Spondylosis without myelopathy or radiculopathy, lumbar region: Secondary | ICD-10-CM | POA: Diagnosis not present

## 2017-05-24 DIAGNOSIS — M545 Low back pain: Secondary | ICD-10-CM | POA: Diagnosis not present

## 2017-05-24 DIAGNOSIS — M5416 Radiculopathy, lumbar region: Secondary | ICD-10-CM | POA: Diagnosis not present

## 2017-05-30 DIAGNOSIS — G245 Blepharospasm: Secondary | ICD-10-CM | POA: Diagnosis not present

## 2017-06-07 DIAGNOSIS — H90A22 Sensorineural hearing loss, unilateral, left ear, with restricted hearing on the contralateral side: Secondary | ICD-10-CM | POA: Diagnosis not present

## 2017-06-07 DIAGNOSIS — H90A31 Mixed conductive and sensorineural hearing loss, unilateral, right ear with restricted hearing on the contralateral side: Secondary | ICD-10-CM | POA: Diagnosis not present

## 2017-06-20 ENCOUNTER — Ambulatory Visit: Payer: Medicare HMO | Admitting: Family Medicine

## 2017-06-20 ENCOUNTER — Encounter: Payer: Self-pay | Admitting: Family Medicine

## 2017-06-20 DIAGNOSIS — M5416 Radiculopathy, lumbar region: Secondary | ICD-10-CM | POA: Diagnosis not present

## 2017-06-20 NOTE — Progress Notes (Signed)
[name] [address], [place] [address] Phone: [phone] Subjective:     CC: back pain follow up   [id number]  Timothy Aguirre is a 83 y.o. male coming in with complaint of back pain patient has severe degenerative disc disease and surgery previously.  Patient's most recent x-rays that show progression.  Patient followed up with his neurosurgeon and I discussed possible conservative therapy.  We discussed this as well.  Patient is still having some intermittent radicular symptoms going down the leg but nothing severe.  Patient states that is not stopping him daily activities.  Patient is significantly aware of the pain no.  Patient rates the severity of pain the last 4 out of 10.  Seems to do better if he takes the gabapentin regularly and just the exercises regularly.       Past Medical History:  Diagnosis Date  . Back pain, chronic   . BPH (benign prostatic hyperplasia)   . Colon polyps 04/19/2010   Cecum  . DJD (degenerative joint disease)   . GERD (gastroesophageal reflux disease)   . Hx of colonoscopy   . Hyperlipidemia   . Premature atrial contractions    Past Surgical History:  Procedure Laterality Date  . ACDF  11/2009   C4-5, C5-C6, C6-7  . microdickectomy  11/1996  . redo decompression  07/30/2009  . right knee arthroscopy     15 years ago   Social History   Socioeconomic History  . Marital status: Married    Spouse name: Vicky  . Number of children: 3  . Years of education: None  . Highest education level: None  Social Needs  . Financial resource strain: None  . Food insecurity - worry: None  . Food insecurity - inability: None  . Transportation needs - medical: None  . Transportation needs - non-medical: None  Occupational History  . Occupation: insurance/investments  Tobacco Use  . Smoking status: Never Smoker  . Smokeless tobacco: Never Used  Substance and Sexual Activity  . Alcohol use: Yes   Alcohol/week: 0.0 oz    Comment: drinks liquor on the weekends  . Drug use: No  . Sexual activity: None  Other Topics Concern  . None  Social History Narrative   Lives in [place].      Works as a Metallurgist.    Allergies  Allergen Reactions  . Cefuroxime Axetil Other (See Comments)    unknown  . Cefazolin Rash    Severe, generalized   . Vancomycin Rash    unknown   Family History  Problem Relation Age of Onset  . Heart attack Father        "enlarged heart" rheumatic fever  . Hypertension Neg Hx   . Colon cancer Neg Hx      Past medical history, social, surgical and family history all reviewed in electronic medical record.  No pertanent information unless stated regarding to the chief complaint.   Review of Systems:Review of systems updated and as accurate as of 06/20/17  No headache, visual changes, nausea, vomiting, diarrhea, constipation, dizziness, abdominal pain, skin rash, fevers, chills, night sweats, weight loss, swollen lymph nodes, body aches, joint swelling, , chest pain, shortness of breath, mood changes.  Positive muscle aches  Objective  Blood pressure 140/82, pulse 67, weight 204 lb (92.5 kg), SpO2 98 %. Systems examined below as of 06/20/17   General: No apparent distress alert and oriented x3 mood and affect normal, dressed  appropriately.  HEENT: Pupils equal, extraocular movements intact  Respiratory: Patient's speak in full sentences and does not appear short of breath  Cardiovascular: No lower extremity edema, non tender, no erythema  Skin: Warm dry intact with no signs of infection or rash on extremities or on axial skeleton.  Abdomen: Soft nontender  Neuro: Cranial nerves II through XII are intact, neurovascularly intact in all extremities with 2+ DTRs and 2+ pulses.  Lymph: No lymphadenopathy of posterior or anterior cervical chain or axillae bilaterally.  Gait normal with good balance and coordination.  MSK:  Non tender with full  range of motion and good stability and symmetric strength and tone of shoulders, elbows, wrist, hip, knee and ankles bilaterally.  Arthritic changes of multiple joints.  Patient does have some right hip decreasing range of motion with internal rotation.  Still mild positive straight leg test with mild weakness and atrophy of the musculature of the upper thigh compared to the contralateral side.  Deep tendon reflexes are intact.    Impression and Recommendations:     This case required medical decision making of moderate complexity.      Note: This dictation was prepared with Dragon dictation along with smaller phrase technology. Any transcriptional errors that result from this process are unintentional.

## 2017-06-20 NOTE — Assessment & Plan Note (Signed)
Spent  25 minutes with patient face-to-face and had greater than 50% of counseling including as described in assessment and plan.  Patient continues to have more of a lumbar radiculopathy.  We attempted a greater trochanteric injection at last exam with no signal improvement though.  We discussed icing regimen, home exercises.  We discussed the possibility of a CT myelogram which patient declined at this time.  Patient will continue with the home exercises at this point.  Answered multiple different questions we discussed continuing the gabapentin faithfully.  We discussed other medications but secondary to patient's age I do not want to have any polypharmacy if possible.  Patient will follow up with me on an as-needed basis

## 2017-07-18 DIAGNOSIS — E7849 Other hyperlipidemia: Secondary | ICD-10-CM | POA: Diagnosis not present

## 2017-07-18 DIAGNOSIS — Z125 Encounter for screening for malignant neoplasm of prostate: Secondary | ICD-10-CM | POA: Diagnosis not present

## 2017-07-18 DIAGNOSIS — R82998 Other abnormal findings in urine: Secondary | ICD-10-CM | POA: Diagnosis not present

## 2017-07-18 DIAGNOSIS — R7301 Impaired fasting glucose: Secondary | ICD-10-CM | POA: Diagnosis not present

## 2017-07-25 DIAGNOSIS — R69 Illness, unspecified: Secondary | ICD-10-CM | POA: Diagnosis not present

## 2017-07-25 DIAGNOSIS — R413 Other amnesia: Secondary | ICD-10-CM | POA: Diagnosis not present

## 2017-07-25 DIAGNOSIS — Z0000 Encounter for general adult medical examination without abnormal findings: Secondary | ICD-10-CM | POA: Diagnosis not present

## 2017-07-25 DIAGNOSIS — N183 Chronic kidney disease, stage 3 (moderate): Secondary | ICD-10-CM | POA: Diagnosis not present

## 2017-07-25 DIAGNOSIS — R7301 Impaired fasting glucose: Secondary | ICD-10-CM | POA: Diagnosis not present

## 2017-07-25 DIAGNOSIS — E7849 Other hyperlipidemia: Secondary | ICD-10-CM | POA: Diagnosis not present

## 2017-07-25 DIAGNOSIS — R1031 Right lower quadrant pain: Secondary | ICD-10-CM | POA: Diagnosis not present

## 2017-07-25 DIAGNOSIS — I700 Atherosclerosis of aorta: Secondary | ICD-10-CM | POA: Diagnosis not present

## 2017-07-25 DIAGNOSIS — G4733 Obstructive sleep apnea (adult) (pediatric): Secondary | ICD-10-CM | POA: Diagnosis not present

## 2017-07-25 DIAGNOSIS — R159 Full incontinence of feces: Secondary | ICD-10-CM | POA: Diagnosis not present

## 2017-07-25 DIAGNOSIS — Z23 Encounter for immunization: Secondary | ICD-10-CM | POA: Diagnosis not present

## 2017-08-04 DIAGNOSIS — Z1212 Encounter for screening for malignant neoplasm of rectum: Secondary | ICD-10-CM | POA: Diagnosis not present

## 2017-08-16 DIAGNOSIS — R3915 Urgency of urination: Secondary | ICD-10-CM | POA: Diagnosis not present

## 2017-08-16 DIAGNOSIS — N401 Enlarged prostate with lower urinary tract symptoms: Secondary | ICD-10-CM | POA: Diagnosis not present

## 2017-08-16 DIAGNOSIS — N5201 Erectile dysfunction due to arterial insufficiency: Secondary | ICD-10-CM | POA: Diagnosis not present

## 2017-08-16 DIAGNOSIS — R3914 Feeling of incomplete bladder emptying: Secondary | ICD-10-CM | POA: Diagnosis not present

## 2017-08-29 DIAGNOSIS — Z85828 Personal history of other malignant neoplasm of skin: Secondary | ICD-10-CM | POA: Diagnosis not present

## 2017-08-29 DIAGNOSIS — L570 Actinic keratosis: Secondary | ICD-10-CM | POA: Diagnosis not present

## 2017-08-29 DIAGNOSIS — D225 Melanocytic nevi of trunk: Secondary | ICD-10-CM | POA: Diagnosis not present

## 2017-08-29 DIAGNOSIS — L812 Freckles: Secondary | ICD-10-CM | POA: Diagnosis not present

## 2017-08-29 DIAGNOSIS — Z85820 Personal history of malignant melanoma of skin: Secondary | ICD-10-CM | POA: Diagnosis not present

## 2017-08-29 DIAGNOSIS — L821 Other seborrheic keratosis: Secondary | ICD-10-CM | POA: Diagnosis not present

## 2017-09-07 ENCOUNTER — Encounter: Payer: Self-pay | Admitting: *Deleted

## 2017-09-08 ENCOUNTER — Ambulatory Visit (INDEPENDENT_AMBULATORY_CARE_PROVIDER_SITE_OTHER): Payer: Medicare HMO | Admitting: Internal Medicine

## 2017-09-08 DIAGNOSIS — Z23 Encounter for immunization: Secondary | ICD-10-CM | POA: Diagnosis not present

## 2017-09-08 DIAGNOSIS — Z9189 Other specified personal risk factors, not elsewhere classified: Secondary | ICD-10-CM

## 2017-09-08 DIAGNOSIS — Z7185 Encounter for immunization safety counseling: Secondary | ICD-10-CM

## 2017-09-08 DIAGNOSIS — Z7184 Encounter for health counseling related to travel: Secondary | ICD-10-CM

## 2017-09-08 DIAGNOSIS — Z789 Other specified health status: Secondary | ICD-10-CM | POA: Diagnosis not present

## 2017-09-08 DIAGNOSIS — Z7189 Other specified counseling: Secondary | ICD-10-CM

## 2017-09-08 MED ORDER — AZITHROMYCIN 500 MG PO TABS: 500.0000 mg | ORAL_TABLET | Freq: Every day | ORAL | 0 refills | Status: AC

## 2017-09-08 MED ORDER — ACETAZOLAMIDE 125 MG PO TABS: 125.0000 mg | ORAL_TABLET | Freq: Two times a day (BID) | ORAL | 0 refills | Status: AC

## 2017-09-08 NOTE — Progress Notes (Signed)
Subjective:   Timothy Aguirre is a 84 y.o. male who presents to the Infectious Disease clinic for travel consultation. Planned departure date: may 10-20th [place] and oct 1 thru 9th [place]         Planned return date:  Countries of travel: [place] and [place] Areas in country: tourist destinations Accommodations: hotel Purpose of travel: vacation Prior travel out of [place]: yes  uptodate on vaccines   Objective:   Medications: pantoprazole, atorvastatin, finastoride, tamusolin, gabapentin, oxybutinin, basy aspirin, mvi    Assessment:   No contraindications to travel. none   Plan:    pre travel vaccine = will give hep A #1 and typhoid vaccine. Reviewed itinerary for [place] and does not appear that they would be exposed to any areas endemic to yellow fever.   Malaria prophylaxis = not needed other than giving mosquito bite prevention/deet  Traveler's diarrhea = gave rx for azithromycin to use if needed  Altitude sickness = gave rx for acetazolamide for upcoming trip to [place]

## 2017-09-27 DIAGNOSIS — Z6827 Body mass index (BMI) 27.0-27.9, adult: Secondary | ICD-10-CM | POA: Diagnosis not present

## 2017-09-27 DIAGNOSIS — A084 Viral intestinal infection, unspecified: Secondary | ICD-10-CM | POA: Diagnosis not present

## 2017-09-27 DIAGNOSIS — R197 Diarrhea, unspecified: Secondary | ICD-10-CM | POA: Diagnosis not present

## 2017-09-27 DIAGNOSIS — R5383 Other fatigue: Secondary | ICD-10-CM | POA: Diagnosis not present

## 2017-10-03 DIAGNOSIS — G245 Blepharospasm: Secondary | ICD-10-CM | POA: Diagnosis not present

## 2017-10-04 DIAGNOSIS — M79645 Pain in left finger(s): Secondary | ICD-10-CM | POA: Diagnosis not present

## 2017-10-04 DIAGNOSIS — M65342 Trigger finger, left ring finger: Secondary | ICD-10-CM | POA: Diagnosis not present

## 2018-02-06 DIAGNOSIS — G245 Blepharospasm: Secondary | ICD-10-CM | POA: Diagnosis not present

## 2018-02-12 DIAGNOSIS — R69 Illness, unspecified: Secondary | ICD-10-CM | POA: Diagnosis not present

## 2018-02-23 DIAGNOSIS — M5136 Other intervertebral disc degeneration, lumbar region: Secondary | ICD-10-CM | POA: Diagnosis not present

## 2018-02-23 DIAGNOSIS — M65342 Trigger finger, left ring finger: Secondary | ICD-10-CM | POA: Diagnosis not present

## 2018-02-23 DIAGNOSIS — S39012A Strain of muscle, fascia and tendon of lower back, initial encounter: Secondary | ICD-10-CM | POA: Diagnosis not present

## 2018-02-23 DIAGNOSIS — M25551 Pain in right hip: Secondary | ICD-10-CM | POA: Diagnosis not present

## 2018-04-23 DIAGNOSIS — E7849 Other hyperlipidemia: Secondary | ICD-10-CM | POA: Diagnosis not present

## 2018-04-23 DIAGNOSIS — G4733 Obstructive sleep apnea (adult) (pediatric): Secondary | ICD-10-CM | POA: Diagnosis not present

## 2018-04-23 DIAGNOSIS — R1031 Right lower quadrant pain: Secondary | ICD-10-CM | POA: Diagnosis not present

## 2018-04-23 DIAGNOSIS — N183 Chronic kidney disease, stage 3 (moderate): Secondary | ICD-10-CM | POA: Diagnosis not present

## 2018-04-23 DIAGNOSIS — G4709 Other insomnia: Secondary | ICD-10-CM | POA: Diagnosis not present

## 2018-04-23 DIAGNOSIS — I700 Atherosclerosis of aorta: Secondary | ICD-10-CM | POA: Diagnosis not present

## 2018-04-23 DIAGNOSIS — G250 Essential tremor: Secondary | ICD-10-CM | POA: Diagnosis not present

## 2018-04-23 DIAGNOSIS — Z23 Encounter for immunization: Secondary | ICD-10-CM | POA: Diagnosis not present

## 2018-04-23 DIAGNOSIS — R413 Other amnesia: Secondary | ICD-10-CM | POA: Diagnosis not present

## 2018-04-23 DIAGNOSIS — R7301 Impaired fasting glucose: Secondary | ICD-10-CM | POA: Diagnosis not present

## 2018-04-24 DIAGNOSIS — R69 Illness, unspecified: Secondary | ICD-10-CM | POA: Diagnosis not present

## 2018-04-30 DIAGNOSIS — M6530 Trigger finger, unspecified finger: Secondary | ICD-10-CM | POA: Diagnosis not present

## 2018-04-30 DIAGNOSIS — M7061 Trochanteric bursitis, right hip: Secondary | ICD-10-CM | POA: Diagnosis not present

## 2018-04-30 DIAGNOSIS — M48061 Spinal stenosis, lumbar region without neurogenic claudication: Secondary | ICD-10-CM | POA: Diagnosis not present

## 2018-04-30 DIAGNOSIS — M479 Spondylosis, unspecified: Secondary | ICD-10-CM | POA: Diagnosis not present

## 2018-05-11 DIAGNOSIS — D3101 Benign neoplasm of right conjunctiva: Secondary | ICD-10-CM | POA: Diagnosis not present

## 2018-05-14 DIAGNOSIS — R69 Illness, unspecified: Secondary | ICD-10-CM | POA: Diagnosis not present

## 2018-05-18 DIAGNOSIS — D3101 Benign neoplasm of right conjunctiva: Secondary | ICD-10-CM | POA: Diagnosis not present

## 2018-05-21 ENCOUNTER — Other Ambulatory Visit: Payer: Self-pay | Admitting: Ophthalmology

## 2018-05-21 DIAGNOSIS — D487 Neoplasm of uncertain behavior of other specified sites: Secondary | ICD-10-CM | POA: Diagnosis not present

## 2018-05-21 DIAGNOSIS — D2310 Other benign neoplasm of skin of unspecified eyelid, including canthus: Secondary | ICD-10-CM | POA: Diagnosis not present

## 2018-06-05 DIAGNOSIS — G245 Blepharospasm: Secondary | ICD-10-CM | POA: Diagnosis not present

## 2018-06-06 DIAGNOSIS — M545 Low back pain: Secondary | ICD-10-CM | POA: Diagnosis not present

## 2018-06-20 DIAGNOSIS — M545 Low back pain: Secondary | ICD-10-CM | POA: Diagnosis not present

## 2018-07-19 DIAGNOSIS — Z125 Encounter for screening for malignant neoplasm of prostate: Secondary | ICD-10-CM | POA: Diagnosis not present

## 2018-07-19 DIAGNOSIS — E7849 Other hyperlipidemia: Secondary | ICD-10-CM | POA: Diagnosis not present

## 2018-07-19 DIAGNOSIS — R82998 Other abnormal findings in urine: Secondary | ICD-10-CM | POA: Diagnosis not present

## 2018-07-19 DIAGNOSIS — R7301 Impaired fasting glucose: Secondary | ICD-10-CM | POA: Diagnosis not present

## 2018-07-26 DIAGNOSIS — M1990 Unspecified osteoarthritis, unspecified site: Secondary | ICD-10-CM | POA: Diagnosis not present

## 2018-07-26 DIAGNOSIS — R413 Other amnesia: Secondary | ICD-10-CM | POA: Diagnosis not present

## 2018-07-26 DIAGNOSIS — R7301 Impaired fasting glucose: Secondary | ICD-10-CM | POA: Diagnosis not present

## 2018-07-26 DIAGNOSIS — G4733 Obstructive sleep apnea (adult) (pediatric): Secondary | ICD-10-CM | POA: Diagnosis not present

## 2018-07-26 DIAGNOSIS — I700 Atherosclerosis of aorta: Secondary | ICD-10-CM | POA: Diagnosis not present

## 2018-07-26 DIAGNOSIS — G250 Essential tremor: Secondary | ICD-10-CM | POA: Diagnosis not present

## 2018-07-26 DIAGNOSIS — R69 Illness, unspecified: Secondary | ICD-10-CM | POA: Diagnosis not present

## 2018-07-26 DIAGNOSIS — R159 Full incontinence of feces: Secondary | ICD-10-CM | POA: Diagnosis not present

## 2018-07-26 DIAGNOSIS — E7849 Other hyperlipidemia: Secondary | ICD-10-CM | POA: Diagnosis not present

## 2018-07-26 DIAGNOSIS — N183 Chronic kidney disease, stage 3 (moderate): Secondary | ICD-10-CM | POA: Diagnosis not present

## 2018-07-27 DIAGNOSIS — Z85828 Personal history of other malignant neoplasm of skin: Secondary | ICD-10-CM | POA: Diagnosis not present

## 2018-07-27 DIAGNOSIS — L111 Transient acantholytic dermatosis [Grover]: Secondary | ICD-10-CM | POA: Diagnosis not present

## 2018-07-27 DIAGNOSIS — Z85820 Personal history of malignant melanoma of skin: Secondary | ICD-10-CM | POA: Diagnosis not present

## 2018-07-27 DIAGNOSIS — Z1212 Encounter for screening for malignant neoplasm of rectum: Secondary | ICD-10-CM | POA: Diagnosis not present

## 2018-07-27 DIAGNOSIS — L821 Other seborrheic keratosis: Secondary | ICD-10-CM | POA: Diagnosis not present

## 2018-08-22 DIAGNOSIS — N401 Enlarged prostate with lower urinary tract symptoms: Secondary | ICD-10-CM | POA: Diagnosis not present

## 2018-08-22 DIAGNOSIS — R350 Frequency of micturition: Secondary | ICD-10-CM | POA: Diagnosis not present

## 2018-08-22 DIAGNOSIS — N5201 Erectile dysfunction due to arterial insufficiency: Secondary | ICD-10-CM | POA: Diagnosis not present

## 2018-08-27 DIAGNOSIS — R350 Frequency of micturition: Secondary | ICD-10-CM | POA: Diagnosis not present

## 2018-08-27 DIAGNOSIS — N401 Enlarged prostate with lower urinary tract symptoms: Secondary | ICD-10-CM | POA: Diagnosis not present

## 2018-09-10 DIAGNOSIS — D485 Neoplasm of uncertain behavior of skin: Secondary | ICD-10-CM | POA: Diagnosis not present

## 2018-09-10 DIAGNOSIS — L812 Freckles: Secondary | ICD-10-CM | POA: Diagnosis not present

## 2018-09-10 DIAGNOSIS — L603 Nail dystrophy: Secondary | ICD-10-CM | POA: Diagnosis not present

## 2018-09-10 DIAGNOSIS — L820 Inflamed seborrheic keratosis: Secondary | ICD-10-CM | POA: Diagnosis not present

## 2018-09-10 DIAGNOSIS — L821 Other seborrheic keratosis: Secondary | ICD-10-CM | POA: Diagnosis not present

## 2018-09-10 DIAGNOSIS — D1801 Hemangioma of skin and subcutaneous tissue: Secondary | ICD-10-CM | POA: Diagnosis not present

## 2018-09-10 DIAGNOSIS — Z85828 Personal history of other malignant neoplasm of skin: Secondary | ICD-10-CM | POA: Diagnosis not present

## 2018-09-10 DIAGNOSIS — Z85820 Personal history of malignant melanoma of skin: Secondary | ICD-10-CM | POA: Diagnosis not present

## 2018-09-11 DIAGNOSIS — H903 Sensorineural hearing loss, bilateral: Secondary | ICD-10-CM | POA: Diagnosis not present

## 2018-11-06 DIAGNOSIS — M65342 Trigger finger, left ring finger: Secondary | ICD-10-CM | POA: Diagnosis not present

## 2018-11-16 DIAGNOSIS — M65342 Trigger finger, left ring finger: Secondary | ICD-10-CM | POA: Diagnosis not present

## 2019-01-16 DIAGNOSIS — Z20828 Contact with and (suspected) exposure to other viral communicable diseases: Secondary | ICD-10-CM | POA: Diagnosis not present

## 2019-02-12 DIAGNOSIS — N183 Chronic kidney disease, stage 3 (moderate): Secondary | ICD-10-CM | POA: Diagnosis not present

## 2019-02-12 DIAGNOSIS — G245 Blepharospasm: Secondary | ICD-10-CM | POA: Diagnosis not present

## 2019-02-12 DIAGNOSIS — R7301 Impaired fasting glucose: Secondary | ICD-10-CM | POA: Diagnosis not present

## 2019-02-12 DIAGNOSIS — E663 Overweight: Secondary | ICD-10-CM | POA: Diagnosis not present

## 2019-02-12 DIAGNOSIS — N401 Enlarged prostate with lower urinary tract symptoms: Secondary | ICD-10-CM | POA: Diagnosis not present

## 2019-02-12 DIAGNOSIS — R413 Other amnesia: Secondary | ICD-10-CM | POA: Diagnosis not present

## 2019-02-12 DIAGNOSIS — R69 Illness, unspecified: Secondary | ICD-10-CM | POA: Diagnosis not present

## 2019-02-13 DIAGNOSIS — R7301 Impaired fasting glucose: Secondary | ICD-10-CM | POA: Diagnosis not present

## 2019-02-13 DIAGNOSIS — Z961 Presence of intraocular lens: Secondary | ICD-10-CM | POA: Diagnosis not present

## 2019-02-13 DIAGNOSIS — H3500 Unspecified background retinopathy: Secondary | ICD-10-CM | POA: Diagnosis not present

## 2019-03-16 DIAGNOSIS — Z23 Encounter for immunization: Secondary | ICD-10-CM | POA: Diagnosis not present

## 2019-04-01 DIAGNOSIS — Z111 Encounter for screening for respiratory tuberculosis: Secondary | ICD-10-CM | POA: Diagnosis not present

## 2019-04-18 DIAGNOSIS — N138 Other obstructive and reflux uropathy: Secondary | ICD-10-CM | POA: Diagnosis not present

## 2019-04-18 DIAGNOSIS — N401 Enlarged prostate with lower urinary tract symptoms: Secondary | ICD-10-CM | POA: Diagnosis not present

## 2019-04-23 DIAGNOSIS — R69 Illness, unspecified: Secondary | ICD-10-CM | POA: Diagnosis not present

## 2019-05-23 ENCOUNTER — Other Ambulatory Visit: Payer: Self-pay

## 2019-05-23 DIAGNOSIS — Z20822 Contact with and (suspected) exposure to covid-19: Secondary | ICD-10-CM

## 2019-05-26 LAB — NOVEL CORONAVIRUS, NAA: SARS-CoV-2, NAA: NOT DETECTED

## 2019-07-23 DIAGNOSIS — R7301 Impaired fasting glucose: Secondary | ICD-10-CM | POA: Diagnosis not present

## 2019-07-23 DIAGNOSIS — E7849 Other hyperlipidemia: Secondary | ICD-10-CM | POA: Diagnosis not present

## 2019-07-23 DIAGNOSIS — Z125 Encounter for screening for malignant neoplasm of prostate: Secondary | ICD-10-CM | POA: Diagnosis not present

## 2019-07-23 DIAGNOSIS — R82998 Other abnormal findings in urine: Secondary | ICD-10-CM | POA: Diagnosis not present

## 2019-07-30 DIAGNOSIS — G245 Blepharospasm: Secondary | ICD-10-CM | POA: Diagnosis not present

## 2019-07-30 DIAGNOSIS — R0981 Nasal congestion: Secondary | ICD-10-CM | POA: Diagnosis not present

## 2019-07-30 DIAGNOSIS — Z0000 Encounter for general adult medical examination without abnormal findings: Secondary | ICD-10-CM | POA: Diagnosis not present

## 2019-07-30 DIAGNOSIS — H40009 Preglaucoma, unspecified, unspecified eye: Secondary | ICD-10-CM | POA: Diagnosis not present

## 2019-07-30 DIAGNOSIS — R413 Other amnesia: Secondary | ICD-10-CM | POA: Diagnosis not present

## 2019-07-30 DIAGNOSIS — N401 Enlarged prostate with lower urinary tract symptoms: Secondary | ICD-10-CM | POA: Diagnosis not present

## 2019-07-30 DIAGNOSIS — R159 Full incontinence of feces: Secondary | ICD-10-CM | POA: Diagnosis not present

## 2019-07-30 DIAGNOSIS — I700 Atherosclerosis of aorta: Secondary | ICD-10-CM | POA: Diagnosis not present

## 2019-07-30 DIAGNOSIS — R69 Illness, unspecified: Secondary | ICD-10-CM | POA: Diagnosis not present

## 2019-07-30 DIAGNOSIS — Z1331 Encounter for screening for depression: Secondary | ICD-10-CM | POA: Diagnosis not present

## 2019-07-30 DIAGNOSIS — N1831 Chronic kidney disease, stage 3a: Secondary | ICD-10-CM | POA: Diagnosis not present

## 2019-08-01 DIAGNOSIS — Z1212 Encounter for screening for malignant neoplasm of rectum: Secondary | ICD-10-CM | POA: Diagnosis not present

## 2019-08-13 DIAGNOSIS — M79645 Pain in left finger(s): Secondary | ICD-10-CM | POA: Diagnosis not present

## 2019-08-13 DIAGNOSIS — H35072 Retinal telangiectasis, left eye: Secondary | ICD-10-CM | POA: Diagnosis not present

## 2019-08-13 DIAGNOSIS — M65352 Trigger finger, left little finger: Secondary | ICD-10-CM | POA: Diagnosis not present

## 2019-08-13 DIAGNOSIS — H26493 Other secondary cataract, bilateral: Secondary | ICD-10-CM | POA: Diagnosis not present

## 2019-08-13 DIAGNOSIS — H3500 Unspecified background retinopathy: Secondary | ICD-10-CM | POA: Diagnosis not present

## 2019-11-04 DIAGNOSIS — R002 Palpitations: Secondary | ICD-10-CM | POA: Diagnosis not present

## 2019-11-04 DIAGNOSIS — R42 Dizziness and giddiness: Secondary | ICD-10-CM | POA: Diagnosis not present

## 2019-11-11 ENCOUNTER — Telehealth: Payer: Self-pay

## 2019-11-11 NOTE — Telephone Encounter (Signed)
Received referral for 24 hr holter.  This monitor is not available at this clinic.   Patient prefers to just have a monitor placed and have a single trip visit vs setting up referral consultation with a provider.   Patient would like to do this at [facility] and this is an option for the order placed by Dr. Virgina Jock on the referral.   Faxing back to [facility] to have clinc call [phone].    Referral discarded

## 2019-11-21 ENCOUNTER — Other Ambulatory Visit: Payer: Self-pay | Admitting: Internal Medicine

## 2019-11-21 DIAGNOSIS — Z7982 Long term (current) use of aspirin: Secondary | ICD-10-CM | POA: Diagnosis not present

## 2019-11-21 DIAGNOSIS — R002 Palpitations: Secondary | ICD-10-CM

## 2019-11-21 DIAGNOSIS — R972 Elevated prostate specific antigen [PSA]: Secondary | ICD-10-CM | POA: Diagnosis not present

## 2019-11-21 DIAGNOSIS — R42 Dizziness and giddiness: Secondary | ICD-10-CM

## 2019-11-21 DIAGNOSIS — N529 Male erectile dysfunction, unspecified: Secondary | ICD-10-CM | POA: Diagnosis not present

## 2019-11-21 DIAGNOSIS — Z79899 Other long term (current) drug therapy: Secondary | ICD-10-CM | POA: Diagnosis not present

## 2019-11-21 DIAGNOSIS — N400 Enlarged prostate without lower urinary tract symptoms: Secondary | ICD-10-CM | POA: Diagnosis not present

## 2019-11-26 ENCOUNTER — Ambulatory Visit
Admission: RE | Admit: 2019-11-26 | Discharge: 2019-11-26 | Disposition: A | Discharge status: Home / Self Care | Payer: Medicare HMO | Source: Ambulatory Visit | Attending: Internal Medicine | Admitting: Internal Medicine

## 2019-11-26 ENCOUNTER — Other Ambulatory Visit: Payer: Self-pay

## 2019-11-26 DIAGNOSIS — R42 Dizziness and giddiness: Secondary | ICD-10-CM | POA: Insufficient documentation

## 2019-11-26 DIAGNOSIS — I493 Ventricular premature depolarization: Secondary | ICD-10-CM | POA: Diagnosis not present

## 2019-11-26 DIAGNOSIS — R002 Palpitations: Secondary | ICD-10-CM | POA: Diagnosis not present

## 2019-11-26 DIAGNOSIS — I491 Atrial premature depolarization: Secondary | ICD-10-CM | POA: Insufficient documentation

## 2019-12-18 DIAGNOSIS — Z85828 Personal history of other malignant neoplasm of skin: Secondary | ICD-10-CM | POA: Diagnosis not present

## 2019-12-18 DIAGNOSIS — C44519 Basal cell carcinoma of skin of other part of trunk: Secondary | ICD-10-CM | POA: Diagnosis not present

## 2019-12-18 DIAGNOSIS — L821 Other seborrheic keratosis: Secondary | ICD-10-CM | POA: Diagnosis not present

## 2019-12-18 DIAGNOSIS — L570 Actinic keratosis: Secondary | ICD-10-CM | POA: Diagnosis not present

## 2019-12-18 DIAGNOSIS — D492 Neoplasm of unspecified behavior of bone, soft tissue, and skin: Secondary | ICD-10-CM | POA: Diagnosis not present

## 2019-12-18 DIAGNOSIS — D229 Melanocytic nevi, unspecified: Secondary | ICD-10-CM | POA: Diagnosis not present

## 2019-12-18 DIAGNOSIS — L719 Rosacea, unspecified: Secondary | ICD-10-CM | POA: Diagnosis not present

## 2019-12-18 DIAGNOSIS — L578 Other skin changes due to chronic exposure to nonionizing radiation: Secondary | ICD-10-CM | POA: Diagnosis not present

## 2019-12-18 DIAGNOSIS — L219 Seborrheic dermatitis, unspecified: Secondary | ICD-10-CM | POA: Diagnosis not present

## 2019-12-18 DIAGNOSIS — C44712 Basal cell carcinoma of skin of right lower limb, including hip: Secondary | ICD-10-CM | POA: Diagnosis not present

## 2019-12-18 DIAGNOSIS — L814 Other melanin hyperpigmentation: Secondary | ICD-10-CM | POA: Diagnosis not present

## 2019-12-24 DIAGNOSIS — G245 Blepharospasm: Secondary | ICD-10-CM | POA: Diagnosis not present

## 2019-12-26 DIAGNOSIS — C44519 Basal cell carcinoma of skin of other part of trunk: Secondary | ICD-10-CM | POA: Diagnosis not present

## 2019-12-26 DIAGNOSIS — C44712 Basal cell carcinoma of skin of right lower limb, including hip: Secondary | ICD-10-CM | POA: Diagnosis not present

## 2019-12-31 DIAGNOSIS — M65342 Trigger finger, left ring finger: Secondary | ICD-10-CM | POA: Diagnosis not present

## 2019-12-31 DIAGNOSIS — M65331 Trigger finger, right middle finger: Secondary | ICD-10-CM | POA: Diagnosis not present

## 2019-12-31 DIAGNOSIS — M65352 Trigger finger, left little finger: Secondary | ICD-10-CM | POA: Diagnosis not present

## 2019-12-31 DIAGNOSIS — M79645 Pain in left finger(s): Secondary | ICD-10-CM | POA: Diagnosis not present

## 2020-02-10 DIAGNOSIS — R252 Cramp and spasm: Secondary | ICD-10-CM | POA: Diagnosis not present

## 2020-02-10 DIAGNOSIS — R42 Dizziness and giddiness: Secondary | ICD-10-CM | POA: Diagnosis not present

## 2020-02-10 DIAGNOSIS — M1990 Unspecified osteoarthritis, unspecified site: Secondary | ICD-10-CM | POA: Diagnosis not present

## 2020-02-10 DIAGNOSIS — R413 Other amnesia: Secondary | ICD-10-CM | POA: Diagnosis not present

## 2020-02-10 DIAGNOSIS — R002 Palpitations: Secondary | ICD-10-CM | POA: Diagnosis not present

## 2020-02-12 DIAGNOSIS — M47816 Spondylosis without myelopathy or radiculopathy, lumbar region: Secondary | ICD-10-CM | POA: Diagnosis not present

## 2020-02-12 DIAGNOSIS — M5416 Radiculopathy, lumbar region: Secondary | ICD-10-CM | POA: Diagnosis not present

## 2020-02-12 DIAGNOSIS — M545 Low back pain: Secondary | ICD-10-CM | POA: Diagnosis not present

## 2020-02-19 DIAGNOSIS — T148XXA Other injury of unspecified body region, initial encounter: Secondary | ICD-10-CM | POA: Diagnosis not present

## 2020-03-18 DIAGNOSIS — M5126 Other intervertebral disc displacement, lumbar region: Secondary | ICD-10-CM | POA: Diagnosis not present

## 2020-03-18 DIAGNOSIS — Z6828 Body mass index (BMI) 28.0-28.9, adult: Secondary | ICD-10-CM | POA: Diagnosis not present

## 2020-03-18 DIAGNOSIS — M545 Low back pain: Secondary | ICD-10-CM | POA: Diagnosis not present

## 2020-03-18 DIAGNOSIS — R030 Elevated blood-pressure reading, without diagnosis of hypertension: Secondary | ICD-10-CM | POA: Diagnosis not present

## 2020-03-18 DIAGNOSIS — M47816 Spondylosis without myelopathy or radiculopathy, lumbar region: Secondary | ICD-10-CM | POA: Diagnosis not present

## 2020-03-18 DIAGNOSIS — M5127 Other intervertebral disc displacement, lumbosacral region: Secondary | ICD-10-CM | POA: Diagnosis not present

## 2020-03-18 DIAGNOSIS — M4316 Spondylolisthesis, lumbar region: Secondary | ICD-10-CM | POA: Diagnosis not present

## 2020-03-18 DIAGNOSIS — M5416 Radiculopathy, lumbar region: Secondary | ICD-10-CM | POA: Diagnosis not present

## 2020-03-18 DIAGNOSIS — M4807 Spinal stenosis, lumbosacral region: Secondary | ICD-10-CM | POA: Diagnosis not present

## 2020-04-20 DIAGNOSIS — R69 Illness, unspecified: Secondary | ICD-10-CM | POA: Diagnosis not present

## 2020-04-23 DIAGNOSIS — H903 Sensorineural hearing loss, bilateral: Secondary | ICD-10-CM | POA: Diagnosis not present

## 2020-05-07 DIAGNOSIS — M5416 Radiculopathy, lumbar region: Secondary | ICD-10-CM | POA: Diagnosis not present

## 2020-05-22 DIAGNOSIS — N529 Male erectile dysfunction, unspecified: Secondary | ICD-10-CM | POA: Diagnosis not present

## 2020-05-22 DIAGNOSIS — M25551 Pain in right hip: Secondary | ICD-10-CM | POA: Diagnosis not present

## 2020-05-22 DIAGNOSIS — M5416 Radiculopathy, lumbar region: Secondary | ICD-10-CM | POA: Diagnosis not present

## 2020-05-22 DIAGNOSIS — I471 Supraventricular tachycardia: Secondary | ICD-10-CM | POA: Diagnosis not present

## 2020-05-22 DIAGNOSIS — L719 Rosacea, unspecified: Secondary | ICD-10-CM | POA: Diagnosis not present

## 2020-05-26 DIAGNOSIS — G245 Blepharospasm: Secondary | ICD-10-CM | POA: Diagnosis not present

## 2020-05-30 DIAGNOSIS — Z79899 Other long term (current) drug therapy: Secondary | ICD-10-CM | POA: Diagnosis not present

## 2020-05-30 DIAGNOSIS — R0989 Other specified symptoms and signs involving the circulatory and respiratory systems: Secondary | ICD-10-CM | POA: Diagnosis not present

## 2020-05-30 DIAGNOSIS — M1711 Unilateral primary osteoarthritis, right knee: Secondary | ICD-10-CM | POA: Diagnosis not present

## 2020-05-30 DIAGNOSIS — Z888 Allergy status to other drugs, medicaments and biological substances status: Secondary | ICD-10-CM | POA: Diagnosis not present

## 2020-05-30 DIAGNOSIS — R109 Unspecified abdominal pain: Secondary | ICD-10-CM | POA: Diagnosis not present

## 2020-05-30 DIAGNOSIS — J9811 Atelectasis: Secondary | ICD-10-CM | POA: Diagnosis not present

## 2020-05-30 DIAGNOSIS — Z8249 Family history of ischemic heart disease and other diseases of the circulatory system: Secondary | ICD-10-CM | POA: Diagnosis not present

## 2020-05-30 DIAGNOSIS — E785 Hyperlipidemia, unspecified: Secondary | ICD-10-CM | POA: Diagnosis not present

## 2020-05-30 DIAGNOSIS — Z881 Allergy status to other antibiotic agents status: Secondary | ICD-10-CM | POA: Diagnosis not present

## 2020-05-30 DIAGNOSIS — R079 Chest pain, unspecified: Secondary | ICD-10-CM | POA: Diagnosis not present

## 2020-05-30 DIAGNOSIS — Z7982 Long term (current) use of aspirin: Secondary | ICD-10-CM | POA: Diagnosis not present

## 2020-05-30 DIAGNOSIS — R0781 Pleurodynia: Secondary | ICD-10-CM | POA: Diagnosis not present

## 2020-05-31 DIAGNOSIS — R0989 Other specified symptoms and signs involving the circulatory and respiratory systems: Secondary | ICD-10-CM | POA: Diagnosis not present

## 2020-05-31 DIAGNOSIS — J9811 Atelectasis: Secondary | ICD-10-CM | POA: Diagnosis not present

## 2020-05-31 DIAGNOSIS — R079 Chest pain, unspecified: Secondary | ICD-10-CM | POA: Diagnosis not present

## 2020-05-31 DIAGNOSIS — R0781 Pleurodynia: Secondary | ICD-10-CM | POA: Diagnosis not present

## 2020-07-06 DIAGNOSIS — Z6828 Body mass index (BMI) 28.0-28.9, adult: Secondary | ICD-10-CM | POA: Diagnosis not present

## 2020-07-06 DIAGNOSIS — M4316 Spondylolisthesis, lumbar region: Secondary | ICD-10-CM | POA: Diagnosis not present

## 2020-07-06 DIAGNOSIS — M5126 Other intervertebral disc displacement, lumbar region: Secondary | ICD-10-CM | POA: Diagnosis not present

## 2020-07-06 DIAGNOSIS — R030 Elevated blood-pressure reading, without diagnosis of hypertension: Secondary | ICD-10-CM | POA: Diagnosis not present

## 2020-07-06 DIAGNOSIS — M545 Low back pain, unspecified: Secondary | ICD-10-CM | POA: Diagnosis not present

## 2020-07-06 DIAGNOSIS — M47816 Spondylosis without myelopathy or radiculopathy, lumbar region: Secondary | ICD-10-CM | POA: Diagnosis not present

## 2020-07-06 DIAGNOSIS — M5416 Radiculopathy, lumbar region: Secondary | ICD-10-CM | POA: Diagnosis not present

## 2020-07-21 DIAGNOSIS — Z0100 Encounter for examination of eyes and vision without abnormal findings: Secondary | ICD-10-CM | POA: Diagnosis not present

## 2020-08-26 DIAGNOSIS — M545 Low back pain, unspecified: Secondary | ICD-10-CM | POA: Diagnosis not present

## 2020-08-26 DIAGNOSIS — M47816 Spondylosis without myelopathy or radiculopathy, lumbar region: Secondary | ICD-10-CM | POA: Diagnosis not present

## 2020-08-26 DIAGNOSIS — Z6827 Body mass index (BMI) 27.0-27.9, adult: Secondary | ICD-10-CM | POA: Diagnosis not present

## 2020-08-26 DIAGNOSIS — M5416 Radiculopathy, lumbar region: Secondary | ICD-10-CM | POA: Diagnosis not present

## 2020-08-26 DIAGNOSIS — M5126 Other intervertebral disc displacement, lumbar region: Secondary | ICD-10-CM | POA: Diagnosis not present

## 2020-08-26 DIAGNOSIS — M4316 Spondylolisthesis, lumbar region: Secondary | ICD-10-CM | POA: Diagnosis not present

## 2020-08-27 DIAGNOSIS — Z20822 Contact with and (suspected) exposure to covid-19: Secondary | ICD-10-CM | POA: Diagnosis not present

## 2020-08-28 DIAGNOSIS — Z20822 Contact with and (suspected) exposure to covid-19: Secondary | ICD-10-CM | POA: Diagnosis not present

## 2020-09-09 DIAGNOSIS — M6530 Trigger finger, unspecified finger: Secondary | ICD-10-CM | POA: Diagnosis not present

## 2020-09-09 DIAGNOSIS — R69 Illness, unspecified: Secondary | ICD-10-CM | POA: Diagnosis not present

## 2020-09-09 DIAGNOSIS — H579 Unspecified disorder of eye and adnexa: Secondary | ICD-10-CM | POA: Diagnosis not present

## 2020-09-30 DIAGNOSIS — L111 Transient acantholytic dermatosis [Grover]: Secondary | ICD-10-CM | POA: Diagnosis not present

## 2020-09-30 DIAGNOSIS — L853 Xerosis cutis: Secondary | ICD-10-CM | POA: Diagnosis not present

## 2020-09-30 DIAGNOSIS — L84 Corns and callosities: Secondary | ICD-10-CM | POA: Diagnosis not present

## 2020-09-30 DIAGNOSIS — L720 Epidermal cyst: Secondary | ICD-10-CM | POA: Diagnosis not present

## 2020-09-30 DIAGNOSIS — Z85828 Personal history of other malignant neoplasm of skin: Secondary | ICD-10-CM | POA: Diagnosis not present

## 2020-09-30 DIAGNOSIS — L570 Actinic keratosis: Secondary | ICD-10-CM | POA: Diagnosis not present

## 2020-09-30 DIAGNOSIS — L719 Rosacea, unspecified: Secondary | ICD-10-CM | POA: Diagnosis not present

## 2020-09-30 DIAGNOSIS — L821 Other seborrheic keratosis: Secondary | ICD-10-CM | POA: Diagnosis not present

## 2020-10-19 DIAGNOSIS — M65341 Trigger finger, right ring finger: Secondary | ICD-10-CM | POA: Diagnosis not present

## 2020-10-19 DIAGNOSIS — M65331 Trigger finger, right middle finger: Secondary | ICD-10-CM | POA: Diagnosis not present

## 2020-10-19 DIAGNOSIS — M65332 Trigger finger, left middle finger: Secondary | ICD-10-CM | POA: Diagnosis not present

## 2020-10-27 DIAGNOSIS — G245 Blepharospasm: Secondary | ICD-10-CM | POA: Diagnosis not present

## 2020-10-30 DIAGNOSIS — R059 Cough, unspecified: Secondary | ICD-10-CM | POA: Diagnosis not present

## 2020-10-30 DIAGNOSIS — R0981 Nasal congestion: Secondary | ICD-10-CM | POA: Diagnosis not present

## 2020-11-02 DIAGNOSIS — Z20822 Contact with and (suspected) exposure to covid-19: Secondary | ICD-10-CM | POA: Diagnosis not present

## 2020-11-12 DIAGNOSIS — M6530 Trigger finger, unspecified finger: Secondary | ICD-10-CM | POA: Diagnosis not present

## 2020-11-24 DIAGNOSIS — H04123 Dry eye syndrome of bilateral lacrimal glands: Secondary | ICD-10-CM | POA: Diagnosis not present

## 2020-11-24 DIAGNOSIS — H579 Unspecified disorder of eye and adnexa: Secondary | ICD-10-CM | POA: Diagnosis not present

## 2020-11-26 DIAGNOSIS — Z20828 Contact with and (suspected) exposure to other viral communicable diseases: Secondary | ICD-10-CM | POA: Diagnosis not present

## 2020-12-04 DIAGNOSIS — Z131 Encounter for screening for diabetes mellitus: Secondary | ICD-10-CM | POA: Diagnosis not present

## 2020-12-04 DIAGNOSIS — Z136 Encounter for screening for cardiovascular disorders: Secondary | ICD-10-CM | POA: Diagnosis not present

## 2020-12-04 DIAGNOSIS — Z130 Encounter for screening for diseases of the blood and blood-forming organs and certain disorders involving the immune mechanism: Secondary | ICD-10-CM | POA: Diagnosis not present

## 2020-12-11 DIAGNOSIS — N401 Enlarged prostate with lower urinary tract symptoms: Secondary | ICD-10-CM | POA: Diagnosis not present

## 2020-12-11 DIAGNOSIS — I499 Cardiac arrhythmia, unspecified: Secondary | ICD-10-CM | POA: Diagnosis not present

## 2020-12-11 DIAGNOSIS — E785 Hyperlipidemia, unspecified: Secondary | ICD-10-CM | POA: Diagnosis not present

## 2020-12-11 DIAGNOSIS — M25551 Pain in right hip: Secondary | ICD-10-CM | POA: Diagnosis not present

## 2020-12-11 DIAGNOSIS — K219 Gastro-esophageal reflux disease without esophagitis: Secondary | ICD-10-CM | POA: Diagnosis not present

## 2020-12-11 DIAGNOSIS — Z0000 Encounter for general adult medical examination without abnormal findings: Secondary | ICD-10-CM | POA: Diagnosis not present

## 2020-12-11 DIAGNOSIS — N138 Other obstructive and reflux uropathy: Secondary | ICD-10-CM | POA: Diagnosis not present

## 2020-12-11 DIAGNOSIS — R69 Illness, unspecified: Secondary | ICD-10-CM | POA: Diagnosis not present

## 2021-01-01 DIAGNOSIS — R69 Illness, unspecified: Secondary | ICD-10-CM | POA: Diagnosis not present

## 2021-02-02 DIAGNOSIS — M25551 Pain in right hip: Secondary | ICD-10-CM | POA: Diagnosis not present

## 2021-02-02 DIAGNOSIS — R29898 Other symptoms and signs involving the musculoskeletal system: Secondary | ICD-10-CM | POA: Diagnosis not present

## 2021-02-09 DIAGNOSIS — H10829 Rosacea conjunctivitis, unspecified eye: Secondary | ICD-10-CM | POA: Diagnosis not present

## 2021-02-09 DIAGNOSIS — H52203 Unspecified astigmatism, bilateral: Secondary | ICD-10-CM | POA: Diagnosis not present

## 2021-02-09 DIAGNOSIS — Z961 Presence of intraocular lens: Secondary | ICD-10-CM | POA: Diagnosis not present

## 2021-02-09 DIAGNOSIS — H0288A Meibomian gland dysfunction right eye, upper and lower eyelids: Secondary | ICD-10-CM | POA: Diagnosis not present

## 2021-02-09 DIAGNOSIS — H524 Presbyopia: Secondary | ICD-10-CM | POA: Diagnosis not present

## 2021-02-09 DIAGNOSIS — G245 Blepharospasm: Secondary | ICD-10-CM | POA: Diagnosis not present

## 2021-02-09 DIAGNOSIS — H0288B Meibomian gland dysfunction left eye, upper and lower eyelids: Secondary | ICD-10-CM | POA: Diagnosis not present

## 2021-02-09 DIAGNOSIS — H04123 Dry eye syndrome of bilateral lacrimal glands: Secondary | ICD-10-CM | POA: Diagnosis not present

## 2021-02-16 DIAGNOSIS — R29898 Other symptoms and signs involving the musculoskeletal system: Secondary | ICD-10-CM | POA: Diagnosis not present

## 2021-02-16 DIAGNOSIS — M25551 Pain in right hip: Secondary | ICD-10-CM | POA: Diagnosis not present

## 2021-02-23 DIAGNOSIS — R29898 Other symptoms and signs involving the musculoskeletal system: Secondary | ICD-10-CM | POA: Diagnosis not present

## 2021-02-23 DIAGNOSIS — M25551 Pain in right hip: Secondary | ICD-10-CM | POA: Diagnosis not present

## 2021-03-02 DIAGNOSIS — R29898 Other symptoms and signs involving the musculoskeletal system: Secondary | ICD-10-CM | POA: Diagnosis not present

## 2021-03-02 DIAGNOSIS — M25551 Pain in right hip: Secondary | ICD-10-CM | POA: Diagnosis not present

## 2021-03-09 DIAGNOSIS — G245 Blepharospasm: Secondary | ICD-10-CM | POA: Diagnosis not present

## 2021-03-16 DIAGNOSIS — R29898 Other symptoms and signs involving the musculoskeletal system: Secondary | ICD-10-CM | POA: Diagnosis not present

## 2021-03-16 DIAGNOSIS — M25551 Pain in right hip: Secondary | ICD-10-CM | POA: Diagnosis not present

## 2021-03-18 DIAGNOSIS — N138 Other obstructive and reflux uropathy: Secondary | ICD-10-CM | POA: Diagnosis not present

## 2021-03-18 DIAGNOSIS — N401 Enlarged prostate with lower urinary tract symptoms: Secondary | ICD-10-CM | POA: Diagnosis not present

## 2021-03-18 DIAGNOSIS — Z23 Encounter for immunization: Secondary | ICD-10-CM | POA: Diagnosis not present

## 2021-03-18 DIAGNOSIS — D485 Neoplasm of uncertain behavior of skin: Secondary | ICD-10-CM | POA: Diagnosis not present

## 2021-03-18 DIAGNOSIS — L570 Actinic keratosis: Secondary | ICD-10-CM | POA: Diagnosis not present

## 2021-03-23 DIAGNOSIS — R29898 Other symptoms and signs involving the musculoskeletal system: Secondary | ICD-10-CM | POA: Diagnosis not present

## 2021-03-23 DIAGNOSIS — M25551 Pain in right hip: Secondary | ICD-10-CM | POA: Diagnosis not present

## 2021-03-30 DIAGNOSIS — M25551 Pain in right hip: Secondary | ICD-10-CM | POA: Diagnosis not present

## 2021-03-30 DIAGNOSIS — R29898 Other symptoms and signs involving the musculoskeletal system: Secondary | ICD-10-CM | POA: Diagnosis not present

## 2021-04-15 DIAGNOSIS — M65341 Trigger finger, right ring finger: Secondary | ICD-10-CM | POA: Diagnosis not present

## 2021-04-15 DIAGNOSIS — M65332 Trigger finger, left middle finger: Secondary | ICD-10-CM | POA: Diagnosis not present

## 2021-04-16 DIAGNOSIS — M6530 Trigger finger, unspecified finger: Secondary | ICD-10-CM | POA: Diagnosis not present

## 2021-04-16 DIAGNOSIS — N401 Enlarged prostate with lower urinary tract symptoms: Secondary | ICD-10-CM | POA: Diagnosis not present

## 2021-04-16 DIAGNOSIS — L719 Rosacea, unspecified: Secondary | ICD-10-CM | POA: Diagnosis not present

## 2021-04-16 DIAGNOSIS — N138 Other obstructive and reflux uropathy: Secondary | ICD-10-CM | POA: Diagnosis not present

## 2021-04-16 DIAGNOSIS — H10829 Rosacea conjunctivitis, unspecified eye: Secondary | ICD-10-CM | POA: Diagnosis not present

## 2021-05-21 DIAGNOSIS — M25551 Pain in right hip: Secondary | ICD-10-CM | POA: Diagnosis not present

## 2021-05-21 DIAGNOSIS — N138 Other obstructive and reflux uropathy: Secondary | ICD-10-CM | POA: Diagnosis not present

## 2021-05-21 DIAGNOSIS — R69 Illness, unspecified: Secondary | ICD-10-CM | POA: Diagnosis not present

## 2021-05-21 DIAGNOSIS — N401 Enlarged prostate with lower urinary tract symptoms: Secondary | ICD-10-CM | POA: Diagnosis not present

## 2021-05-26 DIAGNOSIS — N400 Enlarged prostate without lower urinary tract symptoms: Secondary | ICD-10-CM | POA: Diagnosis not present

## 2021-07-13 DIAGNOSIS — H8110 Benign paroxysmal vertigo, unspecified ear: Secondary | ICD-10-CM | POA: Diagnosis not present

## 2021-07-28 DIAGNOSIS — G245 Blepharospasm: Secondary | ICD-10-CM | POA: Diagnosis not present

## 2021-08-12 DIAGNOSIS — H0288A Meibomian gland dysfunction right eye, upper and lower eyelids: Secondary | ICD-10-CM | POA: Diagnosis not present

## 2021-08-12 DIAGNOSIS — H0288B Meibomian gland dysfunction left eye, upper and lower eyelids: Secondary | ICD-10-CM | POA: Diagnosis not present

## 2021-08-12 DIAGNOSIS — H04123 Dry eye syndrome of bilateral lacrimal glands: Secondary | ICD-10-CM | POA: Diagnosis not present

## 2021-08-12 DIAGNOSIS — Z961 Presence of intraocular lens: Secondary | ICD-10-CM | POA: Diagnosis not present

## 2021-08-12 DIAGNOSIS — G245 Blepharospasm: Secondary | ICD-10-CM | POA: Diagnosis not present

## 2021-08-12 DIAGNOSIS — H10823 Rosacea conjunctivitis, bilateral: Secondary | ICD-10-CM | POA: Diagnosis not present

## 2021-09-22 DIAGNOSIS — H8110 Benign paroxysmal vertigo, unspecified ear: Secondary | ICD-10-CM | POA: Diagnosis not present

## 2021-09-22 DIAGNOSIS — L570 Actinic keratosis: Secondary | ICD-10-CM | POA: Diagnosis not present

## 2021-09-22 DIAGNOSIS — M5416 Radiculopathy, lumbar region: Secondary | ICD-10-CM | POA: Diagnosis not present

## 2021-09-28 DIAGNOSIS — R42 Dizziness and giddiness: Secondary | ICD-10-CM | POA: Diagnosis not present

## 2021-10-12 DIAGNOSIS — G9389 Other specified disorders of brain: Secondary | ICD-10-CM | POA: Diagnosis not present

## 2021-10-12 DIAGNOSIS — G319 Degenerative disease of nervous system, unspecified: Secondary | ICD-10-CM | POA: Diagnosis not present

## 2021-10-12 DIAGNOSIS — R42 Dizziness and giddiness: Secondary | ICD-10-CM | POA: Diagnosis not present

## 2021-10-26 DIAGNOSIS — Z461 Encounter for fitting and adjustment of hearing aid: Secondary | ICD-10-CM | POA: Diagnosis not present

## 2021-10-26 DIAGNOSIS — H903 Sensorineural hearing loss, bilateral: Secondary | ICD-10-CM | POA: Diagnosis not present

## 2021-11-02 DIAGNOSIS — B351 Tinea unguium: Secondary | ICD-10-CM | POA: Diagnosis not present

## 2021-11-02 DIAGNOSIS — D229 Melanocytic nevi, unspecified: Secondary | ICD-10-CM | POA: Diagnosis not present

## 2021-11-02 DIAGNOSIS — L821 Other seborrheic keratosis: Secondary | ICD-10-CM | POA: Diagnosis not present

## 2021-11-02 DIAGNOSIS — B001 Herpesviral vesicular dermatitis: Secondary | ICD-10-CM | POA: Diagnosis not present

## 2021-11-02 DIAGNOSIS — Z85828 Personal history of other malignant neoplasm of skin: Secondary | ICD-10-CM | POA: Diagnosis not present

## 2021-11-02 DIAGNOSIS — C44519 Basal cell carcinoma of skin of other part of trunk: Secondary | ICD-10-CM | POA: Diagnosis not present

## 2021-11-02 DIAGNOSIS — L209 Atopic dermatitis, unspecified: Secondary | ICD-10-CM | POA: Diagnosis not present

## 2021-11-02 DIAGNOSIS — L111 Transient acantholytic dermatosis [Grover]: Secondary | ICD-10-CM | POA: Diagnosis not present

## 2021-11-02 DIAGNOSIS — D492 Neoplasm of unspecified behavior of bone, soft tissue, and skin: Secondary | ICD-10-CM | POA: Diagnosis not present

## 2021-12-07 DIAGNOSIS — G245 Blepharospasm: Secondary | ICD-10-CM | POA: Diagnosis not present

## 2021-12-13 DIAGNOSIS — H9190 Unspecified hearing loss, unspecified ear: Secondary | ICD-10-CM | POA: Diagnosis not present

## 2021-12-13 DIAGNOSIS — H9313 Tinnitus, bilateral: Secondary | ICD-10-CM | POA: Diagnosis not present

## 2021-12-13 DIAGNOSIS — H532 Diplopia: Secondary | ICD-10-CM | POA: Diagnosis not present

## 2021-12-13 DIAGNOSIS — J385 Laryngeal spasm: Secondary | ICD-10-CM | POA: Diagnosis not present

## 2021-12-13 DIAGNOSIS — T3695XA Adverse effect of unspecified systemic antibiotic, initial encounter: Secondary | ICD-10-CM | POA: Diagnosis not present

## 2021-12-13 DIAGNOSIS — G459 Transient cerebral ischemic attack, unspecified: Secondary | ICD-10-CM | POA: Diagnosis not present

## 2021-12-14 DIAGNOSIS — H04123 Dry eye syndrome of bilateral lacrimal glands: Secondary | ICD-10-CM | POA: Diagnosis not present

## 2021-12-14 DIAGNOSIS — H10829 Rosacea conjunctivitis, unspecified eye: Secondary | ICD-10-CM | POA: Diagnosis not present

## 2021-12-14 DIAGNOSIS — G245 Blepharospasm: Secondary | ICD-10-CM | POA: Diagnosis not present

## 2021-12-14 DIAGNOSIS — H0288A Meibomian gland dysfunction right eye, upper and lower eyelids: Secondary | ICD-10-CM | POA: Diagnosis not present

## 2021-12-14 DIAGNOSIS — Z961 Presence of intraocular lens: Secondary | ICD-10-CM | POA: Diagnosis not present

## 2021-12-14 DIAGNOSIS — H0288B Meibomian gland dysfunction left eye, upper and lower eyelids: Secondary | ICD-10-CM | POA: Diagnosis not present

## 2021-12-14 DIAGNOSIS — H532 Diplopia: Secondary | ICD-10-CM | POA: Diagnosis not present

## 2021-12-22 DIAGNOSIS — G459 Transient cerebral ischemic attack, unspecified: Secondary | ICD-10-CM | POA: Diagnosis not present

## 2021-12-27 DIAGNOSIS — M65332 Trigger finger, left middle finger: Secondary | ICD-10-CM | POA: Diagnosis not present

## 2021-12-27 DIAGNOSIS — M65331 Trigger finger, right middle finger: Secondary | ICD-10-CM | POA: Diagnosis not present

## 2021-12-27 DIAGNOSIS — M65341 Trigger finger, right ring finger: Secondary | ICD-10-CM | POA: Diagnosis not present

## 2021-12-28 DIAGNOSIS — L853 Xerosis cutis: Secondary | ICD-10-CM | POA: Diagnosis not present

## 2021-12-28 DIAGNOSIS — L820 Inflamed seborrheic keratosis: Secondary | ICD-10-CM | POA: Diagnosis not present

## 2021-12-28 DIAGNOSIS — L570 Actinic keratosis: Secondary | ICD-10-CM | POA: Diagnosis not present

## 2021-12-28 DIAGNOSIS — Z85828 Personal history of other malignant neoplasm of skin: Secondary | ICD-10-CM | POA: Diagnosis not present

## 2021-12-28 DIAGNOSIS — G459 Transient cerebral ischemic attack, unspecified: Secondary | ICD-10-CM | POA: Diagnosis not present

## 2021-12-28 DIAGNOSIS — C4491 Basal cell carcinoma of skin, unspecified: Secondary | ICD-10-CM | POA: Diagnosis not present

## 2021-12-28 DIAGNOSIS — I781 Nevus, non-neoplastic: Secondary | ICD-10-CM | POA: Diagnosis not present

## 2021-12-28 DIAGNOSIS — C44519 Basal cell carcinoma of skin of other part of trunk: Secondary | ICD-10-CM | POA: Diagnosis not present

## 2022-01-07 DIAGNOSIS — H9190 Unspecified hearing loss, unspecified ear: Secondary | ICD-10-CM | POA: Diagnosis not present

## 2022-01-07 DIAGNOSIS — R9089 Other abnormal findings on diagnostic imaging of central nervous system: Secondary | ICD-10-CM | POA: Diagnosis not present

## 2022-01-07 DIAGNOSIS — G629 Polyneuropathy, unspecified: Secondary | ICD-10-CM | POA: Diagnosis not present

## 2022-01-07 DIAGNOSIS — I639 Cerebral infarction, unspecified: Secondary | ICD-10-CM | POA: Diagnosis not present

## 2022-01-07 DIAGNOSIS — R2689 Other abnormalities of gait and mobility: Secondary | ICD-10-CM | POA: Diagnosis not present

## 2022-01-11 DIAGNOSIS — Z880 Allergy status to penicillin: Secondary | ICD-10-CM | POA: Diagnosis not present

## 2022-01-11 DIAGNOSIS — M65331 Trigger finger, right middle finger: Secondary | ICD-10-CM | POA: Diagnosis not present

## 2022-01-11 DIAGNOSIS — N400 Enlarged prostate without lower urinary tract symptoms: Secondary | ICD-10-CM | POA: Diagnosis not present

## 2022-01-11 DIAGNOSIS — Z85828 Personal history of other malignant neoplasm of skin: Secondary | ICD-10-CM | POA: Diagnosis not present

## 2022-01-11 DIAGNOSIS — Z7982 Long term (current) use of aspirin: Secondary | ICD-10-CM | POA: Diagnosis not present

## 2022-01-11 DIAGNOSIS — K219 Gastro-esophageal reflux disease without esophagitis: Secondary | ICD-10-CM | POA: Diagnosis not present

## 2022-01-11 DIAGNOSIS — M1990 Unspecified osteoarthritis, unspecified site: Secondary | ICD-10-CM | POA: Diagnosis not present

## 2022-01-11 DIAGNOSIS — M65341 Trigger finger, right ring finger: Secondary | ICD-10-CM | POA: Diagnosis not present

## 2022-01-11 DIAGNOSIS — M5416 Radiculopathy, lumbar region: Secondary | ICD-10-CM | POA: Diagnosis not present

## 2022-01-11 DIAGNOSIS — Z79899 Other long term (current) drug therapy: Secondary | ICD-10-CM | POA: Diagnosis not present

## 2022-01-14 DIAGNOSIS — H903 Sensorineural hearing loss, bilateral: Secondary | ICD-10-CM | POA: Diagnosis not present

## 2022-01-14 DIAGNOSIS — H532 Diplopia: Secondary | ICD-10-CM | POA: Diagnosis not present

## 2022-01-14 DIAGNOSIS — I679 Cerebrovascular disease, unspecified: Secondary | ICD-10-CM | POA: Diagnosis not present

## 2022-01-25 DIAGNOSIS — K219 Gastro-esophageal reflux disease without esophagitis: Secondary | ICD-10-CM | POA: Diagnosis not present

## 2022-01-25 DIAGNOSIS — M65332 Trigger finger, left middle finger: Secondary | ICD-10-CM | POA: Diagnosis not present

## 2022-01-25 DIAGNOSIS — Z85828 Personal history of other malignant neoplasm of skin: Secondary | ICD-10-CM | POA: Diagnosis not present

## 2022-01-25 DIAGNOSIS — Z79899 Other long term (current) drug therapy: Secondary | ICD-10-CM | POA: Diagnosis not present

## 2022-02-08 DIAGNOSIS — M6281 Muscle weakness (generalized): Secondary | ICD-10-CM | POA: Diagnosis not present

## 2022-02-08 DIAGNOSIS — R2689 Other abnormalities of gait and mobility: Secondary | ICD-10-CM | POA: Diagnosis not present

## 2022-02-08 DIAGNOSIS — I639 Cerebral infarction, unspecified: Secondary | ICD-10-CM | POA: Diagnosis not present

## 2022-02-08 DIAGNOSIS — R2681 Unsteadiness on feet: Secondary | ICD-10-CM | POA: Diagnosis not present

## 2022-02-08 DIAGNOSIS — G629 Polyneuropathy, unspecified: Secondary | ICD-10-CM | POA: Diagnosis not present

## 2022-02-10 DIAGNOSIS — R2689 Other abnormalities of gait and mobility: Secondary | ICD-10-CM | POA: Diagnosis not present

## 2022-02-10 DIAGNOSIS — M6281 Muscle weakness (generalized): Secondary | ICD-10-CM | POA: Diagnosis not present

## 2022-02-10 DIAGNOSIS — G629 Polyneuropathy, unspecified: Secondary | ICD-10-CM | POA: Diagnosis not present

## 2022-02-10 DIAGNOSIS — R2681 Unsteadiness on feet: Secondary | ICD-10-CM | POA: Diagnosis not present

## 2022-02-10 DIAGNOSIS — I639 Cerebral infarction, unspecified: Secondary | ICD-10-CM | POA: Diagnosis not present

## 2022-02-14 DIAGNOSIS — G629 Polyneuropathy, unspecified: Secondary | ICD-10-CM | POA: Diagnosis not present

## 2022-02-14 DIAGNOSIS — I639 Cerebral infarction, unspecified: Secondary | ICD-10-CM | POA: Diagnosis not present

## 2022-02-14 DIAGNOSIS — M6281 Muscle weakness (generalized): Secondary | ICD-10-CM | POA: Diagnosis not present

## 2022-02-14 DIAGNOSIS — R2689 Other abnormalities of gait and mobility: Secondary | ICD-10-CM | POA: Diagnosis not present

## 2022-02-14 DIAGNOSIS — R2681 Unsteadiness on feet: Secondary | ICD-10-CM | POA: Diagnosis not present

## 2022-02-17 DIAGNOSIS — M6281 Muscle weakness (generalized): Secondary | ICD-10-CM | POA: Diagnosis not present

## 2022-02-17 DIAGNOSIS — R2681 Unsteadiness on feet: Secondary | ICD-10-CM | POA: Diagnosis not present

## 2022-02-17 DIAGNOSIS — R2689 Other abnormalities of gait and mobility: Secondary | ICD-10-CM | POA: Diagnosis not present

## 2022-02-17 DIAGNOSIS — I639 Cerebral infarction, unspecified: Secondary | ICD-10-CM | POA: Diagnosis not present

## 2022-02-17 DIAGNOSIS — G629 Polyneuropathy, unspecified: Secondary | ICD-10-CM | POA: Diagnosis not present

## 2022-02-21 DIAGNOSIS — I639 Cerebral infarction, unspecified: Secondary | ICD-10-CM | POA: Diagnosis not present

## 2022-02-21 DIAGNOSIS — M6281 Muscle weakness (generalized): Secondary | ICD-10-CM | POA: Diagnosis not present

## 2022-02-21 DIAGNOSIS — R2681 Unsteadiness on feet: Secondary | ICD-10-CM | POA: Diagnosis not present

## 2022-02-21 DIAGNOSIS — R2689 Other abnormalities of gait and mobility: Secondary | ICD-10-CM | POA: Diagnosis not present

## 2022-02-21 DIAGNOSIS — G629 Polyneuropathy, unspecified: Secondary | ICD-10-CM | POA: Diagnosis not present

## 2022-02-24 DIAGNOSIS — R2681 Unsteadiness on feet: Secondary | ICD-10-CM | POA: Diagnosis not present

## 2022-02-24 DIAGNOSIS — R2689 Other abnormalities of gait and mobility: Secondary | ICD-10-CM | POA: Diagnosis not present

## 2022-02-24 DIAGNOSIS — M6281 Muscle weakness (generalized): Secondary | ICD-10-CM | POA: Diagnosis not present

## 2022-02-24 DIAGNOSIS — G629 Polyneuropathy, unspecified: Secondary | ICD-10-CM | POA: Diagnosis not present

## 2022-02-24 DIAGNOSIS — I639 Cerebral infarction, unspecified: Secondary | ICD-10-CM | POA: Diagnosis not present

## 2022-03-01 DIAGNOSIS — I639 Cerebral infarction, unspecified: Secondary | ICD-10-CM | POA: Diagnosis not present

## 2022-03-01 DIAGNOSIS — R2681 Unsteadiness on feet: Secondary | ICD-10-CM | POA: Diagnosis not present

## 2022-03-01 DIAGNOSIS — G629 Polyneuropathy, unspecified: Secondary | ICD-10-CM | POA: Diagnosis not present

## 2022-03-01 DIAGNOSIS — M6281 Muscle weakness (generalized): Secondary | ICD-10-CM | POA: Diagnosis not present

## 2022-03-01 DIAGNOSIS — R2689 Other abnormalities of gait and mobility: Secondary | ICD-10-CM | POA: Diagnosis not present

## 2022-03-03 DIAGNOSIS — I639 Cerebral infarction, unspecified: Secondary | ICD-10-CM | POA: Diagnosis not present

## 2022-03-03 DIAGNOSIS — M6281 Muscle weakness (generalized): Secondary | ICD-10-CM | POA: Diagnosis not present

## 2022-03-03 DIAGNOSIS — R2681 Unsteadiness on feet: Secondary | ICD-10-CM | POA: Diagnosis not present

## 2022-03-03 DIAGNOSIS — G629 Polyneuropathy, unspecified: Secondary | ICD-10-CM | POA: Diagnosis not present

## 2022-03-03 DIAGNOSIS — R2689 Other abnormalities of gait and mobility: Secondary | ICD-10-CM | POA: Diagnosis not present

## 2022-03-08 DIAGNOSIS — R2681 Unsteadiness on feet: Secondary | ICD-10-CM | POA: Diagnosis not present

## 2022-03-08 DIAGNOSIS — G629 Polyneuropathy, unspecified: Secondary | ICD-10-CM | POA: Diagnosis not present

## 2022-03-08 DIAGNOSIS — R2689 Other abnormalities of gait and mobility: Secondary | ICD-10-CM | POA: Diagnosis not present

## 2022-03-08 DIAGNOSIS — I639 Cerebral infarction, unspecified: Secondary | ICD-10-CM | POA: Diagnosis not present

## 2022-03-08 DIAGNOSIS — M6281 Muscle weakness (generalized): Secondary | ICD-10-CM | POA: Diagnosis not present

## 2022-03-10 DIAGNOSIS — I639 Cerebral infarction, unspecified: Secondary | ICD-10-CM | POA: Diagnosis not present

## 2022-03-10 DIAGNOSIS — G629 Polyneuropathy, unspecified: Secondary | ICD-10-CM | POA: Diagnosis not present

## 2022-03-10 DIAGNOSIS — R2681 Unsteadiness on feet: Secondary | ICD-10-CM | POA: Diagnosis not present

## 2022-03-10 DIAGNOSIS — M6281 Muscle weakness (generalized): Secondary | ICD-10-CM | POA: Diagnosis not present

## 2022-03-10 DIAGNOSIS — R2689 Other abnormalities of gait and mobility: Secondary | ICD-10-CM | POA: Diagnosis not present

## 2022-03-14 DIAGNOSIS — R2681 Unsteadiness on feet: Secondary | ICD-10-CM | POA: Diagnosis not present

## 2022-03-14 DIAGNOSIS — M6281 Muscle weakness (generalized): Secondary | ICD-10-CM | POA: Diagnosis not present

## 2022-03-14 DIAGNOSIS — R2689 Other abnormalities of gait and mobility: Secondary | ICD-10-CM | POA: Diagnosis not present

## 2022-03-14 DIAGNOSIS — I639 Cerebral infarction, unspecified: Secondary | ICD-10-CM | POA: Diagnosis not present

## 2022-03-14 DIAGNOSIS — G629 Polyneuropathy, unspecified: Secondary | ICD-10-CM | POA: Diagnosis not present

## 2022-03-17 DIAGNOSIS — G629 Polyneuropathy, unspecified: Secondary | ICD-10-CM | POA: Diagnosis not present

## 2022-03-17 DIAGNOSIS — R2681 Unsteadiness on feet: Secondary | ICD-10-CM | POA: Diagnosis not present

## 2022-03-17 DIAGNOSIS — M25642 Stiffness of left hand, not elsewhere classified: Secondary | ICD-10-CM | POA: Diagnosis not present

## 2022-03-17 DIAGNOSIS — Z9889 Other specified postprocedural states: Secondary | ICD-10-CM | POA: Diagnosis not present

## 2022-03-17 DIAGNOSIS — I639 Cerebral infarction, unspecified: Secondary | ICD-10-CM | POA: Diagnosis not present

## 2022-03-17 DIAGNOSIS — M6281 Muscle weakness (generalized): Secondary | ICD-10-CM | POA: Diagnosis not present

## 2022-03-17 DIAGNOSIS — R2689 Other abnormalities of gait and mobility: Secondary | ICD-10-CM | POA: Diagnosis not present

## 2022-03-21 DIAGNOSIS — I639 Cerebral infarction, unspecified: Secondary | ICD-10-CM | POA: Diagnosis not present

## 2022-03-21 DIAGNOSIS — M6281 Muscle weakness (generalized): Secondary | ICD-10-CM | POA: Diagnosis not present

## 2022-03-21 DIAGNOSIS — G629 Polyneuropathy, unspecified: Secondary | ICD-10-CM | POA: Diagnosis not present

## 2022-03-21 DIAGNOSIS — R2689 Other abnormalities of gait and mobility: Secondary | ICD-10-CM | POA: Diagnosis not present

## 2022-03-21 DIAGNOSIS — R2681 Unsteadiness on feet: Secondary | ICD-10-CM | POA: Diagnosis not present

## 2022-03-22 DIAGNOSIS — Z131 Encounter for screening for diabetes mellitus: Secondary | ICD-10-CM | POA: Diagnosis not present

## 2022-03-22 DIAGNOSIS — Z136 Encounter for screening for cardiovascular disorders: Secondary | ICD-10-CM | POA: Diagnosis not present

## 2022-03-22 DIAGNOSIS — G629 Polyneuropathy, unspecified: Secondary | ICD-10-CM | POA: Diagnosis not present

## 2022-03-22 DIAGNOSIS — Z130 Encounter for screening for diseases of the blood and blood-forming organs and certain disorders involving the immune mechanism: Secondary | ICD-10-CM | POA: Diagnosis not present

## 2022-03-22 DIAGNOSIS — I639 Cerebral infarction, unspecified: Secondary | ICD-10-CM | POA: Diagnosis not present

## 2022-03-22 DIAGNOSIS — M6281 Muscle weakness (generalized): Secondary | ICD-10-CM | POA: Diagnosis not present

## 2022-03-22 DIAGNOSIS — R2681 Unsteadiness on feet: Secondary | ICD-10-CM | POA: Diagnosis not present

## 2022-03-22 DIAGNOSIS — R2689 Other abnormalities of gait and mobility: Secondary | ICD-10-CM | POA: Diagnosis not present

## 2022-03-22 DIAGNOSIS — I679 Cerebrovascular disease, unspecified: Secondary | ICD-10-CM | POA: Diagnosis not present

## 2022-03-24 DIAGNOSIS — G629 Polyneuropathy, unspecified: Secondary | ICD-10-CM | POA: Diagnosis not present

## 2022-03-24 DIAGNOSIS — R2689 Other abnormalities of gait and mobility: Secondary | ICD-10-CM | POA: Diagnosis not present

## 2022-03-24 DIAGNOSIS — M6281 Muscle weakness (generalized): Secondary | ICD-10-CM | POA: Diagnosis not present

## 2022-03-24 DIAGNOSIS — R2681 Unsteadiness on feet: Secondary | ICD-10-CM | POA: Diagnosis not present

## 2022-03-24 DIAGNOSIS — I639 Cerebral infarction, unspecified: Secondary | ICD-10-CM | POA: Diagnosis not present

## 2022-03-28 DIAGNOSIS — I639 Cerebral infarction, unspecified: Secondary | ICD-10-CM | POA: Diagnosis not present

## 2022-03-28 DIAGNOSIS — R2689 Other abnormalities of gait and mobility: Secondary | ICD-10-CM | POA: Diagnosis not present

## 2022-03-28 DIAGNOSIS — M6281 Muscle weakness (generalized): Secondary | ICD-10-CM | POA: Diagnosis not present

## 2022-03-28 DIAGNOSIS — G629 Polyneuropathy, unspecified: Secondary | ICD-10-CM | POA: Diagnosis not present

## 2022-03-28 DIAGNOSIS — R2681 Unsteadiness on feet: Secondary | ICD-10-CM | POA: Diagnosis not present

## 2022-03-29 DIAGNOSIS — N401 Enlarged prostate with lower urinary tract symptoms: Secondary | ICD-10-CM | POA: Diagnosis not present

## 2022-03-29 DIAGNOSIS — N138 Other obstructive and reflux uropathy: Secondary | ICD-10-CM | POA: Diagnosis not present

## 2022-03-29 DIAGNOSIS — R2689 Other abnormalities of gait and mobility: Secondary | ICD-10-CM | POA: Diagnosis not present

## 2022-03-29 DIAGNOSIS — R2681 Unsteadiness on feet: Secondary | ICD-10-CM | POA: Diagnosis not present

## 2022-03-29 DIAGNOSIS — R69 Illness, unspecified: Secondary | ICD-10-CM | POA: Diagnosis not present

## 2022-03-29 DIAGNOSIS — G629 Polyneuropathy, unspecified: Secondary | ICD-10-CM | POA: Diagnosis not present

## 2022-03-29 DIAGNOSIS — R799 Abnormal finding of blood chemistry, unspecified: Secondary | ICD-10-CM | POA: Diagnosis not present

## 2022-03-29 DIAGNOSIS — I679 Cerebrovascular disease, unspecified: Secondary | ICD-10-CM | POA: Diagnosis not present

## 2022-03-29 DIAGNOSIS — Z0000 Encounter for general adult medical examination without abnormal findings: Secondary | ICD-10-CM | POA: Diagnosis not present

## 2022-03-29 DIAGNOSIS — D75839 Thrombocytosis, unspecified: Secondary | ICD-10-CM | POA: Diagnosis not present

## 2022-03-29 DIAGNOSIS — M6281 Muscle weakness (generalized): Secondary | ICD-10-CM | POA: Diagnosis not present

## 2022-03-29 DIAGNOSIS — I639 Cerebral infarction, unspecified: Secondary | ICD-10-CM | POA: Diagnosis not present

## 2022-03-31 DIAGNOSIS — R2681 Unsteadiness on feet: Secondary | ICD-10-CM | POA: Diagnosis not present

## 2022-03-31 DIAGNOSIS — G629 Polyneuropathy, unspecified: Secondary | ICD-10-CM | POA: Diagnosis not present

## 2022-03-31 DIAGNOSIS — M6281 Muscle weakness (generalized): Secondary | ICD-10-CM | POA: Diagnosis not present

## 2022-03-31 DIAGNOSIS — R2689 Other abnormalities of gait and mobility: Secondary | ICD-10-CM | POA: Diagnosis not present

## 2022-03-31 DIAGNOSIS — I639 Cerebral infarction, unspecified: Secondary | ICD-10-CM | POA: Diagnosis not present

## 2022-04-04 DIAGNOSIS — R2681 Unsteadiness on feet: Secondary | ICD-10-CM | POA: Diagnosis not present

## 2022-04-04 DIAGNOSIS — M6281 Muscle weakness (generalized): Secondary | ICD-10-CM | POA: Diagnosis not present

## 2022-04-04 DIAGNOSIS — I639 Cerebral infarction, unspecified: Secondary | ICD-10-CM | POA: Diagnosis not present

## 2022-04-04 DIAGNOSIS — G629 Polyneuropathy, unspecified: Secondary | ICD-10-CM | POA: Diagnosis not present

## 2022-04-04 DIAGNOSIS — R2689 Other abnormalities of gait and mobility: Secondary | ICD-10-CM | POA: Diagnosis not present

## 2022-04-19 DIAGNOSIS — G245 Blepharospasm: Secondary | ICD-10-CM | POA: Diagnosis not present

## 2022-04-25 DIAGNOSIS — R2689 Other abnormalities of gait and mobility: Secondary | ICD-10-CM | POA: Diagnosis not present

## 2022-04-25 DIAGNOSIS — R2681 Unsteadiness on feet: Secondary | ICD-10-CM | POA: Diagnosis not present

## 2022-04-25 DIAGNOSIS — M6281 Muscle weakness (generalized): Secondary | ICD-10-CM | POA: Diagnosis not present

## 2022-04-25 DIAGNOSIS — G629 Polyneuropathy, unspecified: Secondary | ICD-10-CM | POA: Diagnosis not present

## 2022-04-25 DIAGNOSIS — I639 Cerebral infarction, unspecified: Secondary | ICD-10-CM | POA: Diagnosis not present

## 2022-04-27 DIAGNOSIS — M6281 Muscle weakness (generalized): Secondary | ICD-10-CM | POA: Diagnosis not present

## 2022-04-27 DIAGNOSIS — R2681 Unsteadiness on feet: Secondary | ICD-10-CM | POA: Diagnosis not present

## 2022-04-27 DIAGNOSIS — I639 Cerebral infarction, unspecified: Secondary | ICD-10-CM | POA: Diagnosis not present

## 2022-04-27 DIAGNOSIS — R2689 Other abnormalities of gait and mobility: Secondary | ICD-10-CM | POA: Diagnosis not present

## 2022-04-27 DIAGNOSIS — G629 Polyneuropathy, unspecified: Secondary | ICD-10-CM | POA: Diagnosis not present

## 2022-05-02 DIAGNOSIS — I639 Cerebral infarction, unspecified: Secondary | ICD-10-CM | POA: Diagnosis not present

## 2022-05-02 DIAGNOSIS — R2681 Unsteadiness on feet: Secondary | ICD-10-CM | POA: Diagnosis not present

## 2022-05-02 DIAGNOSIS — G629 Polyneuropathy, unspecified: Secondary | ICD-10-CM | POA: Diagnosis not present

## 2022-05-02 DIAGNOSIS — R2689 Other abnormalities of gait and mobility: Secondary | ICD-10-CM | POA: Diagnosis not present

## 2022-05-02 DIAGNOSIS — M6281 Muscle weakness (generalized): Secondary | ICD-10-CM | POA: Diagnosis not present

## 2022-05-05 DIAGNOSIS — I639 Cerebral infarction, unspecified: Secondary | ICD-10-CM | POA: Diagnosis not present

## 2022-05-05 DIAGNOSIS — R2689 Other abnormalities of gait and mobility: Secondary | ICD-10-CM | POA: Diagnosis not present

## 2022-05-05 DIAGNOSIS — M6281 Muscle weakness (generalized): Secondary | ICD-10-CM | POA: Diagnosis not present

## 2022-05-05 DIAGNOSIS — R2681 Unsteadiness on feet: Secondary | ICD-10-CM | POA: Diagnosis not present

## 2022-05-05 DIAGNOSIS — G629 Polyneuropathy, unspecified: Secondary | ICD-10-CM | POA: Diagnosis not present

## 2022-05-17 DIAGNOSIS — H0288A Meibomian gland dysfunction right eye, upper and lower eyelids: Secondary | ICD-10-CM | POA: Diagnosis not present

## 2022-05-17 DIAGNOSIS — H0288B Meibomian gland dysfunction left eye, upper and lower eyelids: Secondary | ICD-10-CM | POA: Diagnosis not present

## 2022-05-17 DIAGNOSIS — H10829 Rosacea conjunctivitis, unspecified eye: Secondary | ICD-10-CM | POA: Diagnosis not present

## 2022-05-17 DIAGNOSIS — Z961 Presence of intraocular lens: Secondary | ICD-10-CM | POA: Diagnosis not present

## 2022-05-17 DIAGNOSIS — H532 Diplopia: Secondary | ICD-10-CM | POA: Diagnosis not present

## 2022-05-17 DIAGNOSIS — G245 Blepharospasm: Secondary | ICD-10-CM | POA: Diagnosis not present

## 2022-05-17 DIAGNOSIS — H04123 Dry eye syndrome of bilateral lacrimal glands: Secondary | ICD-10-CM | POA: Diagnosis not present

## 2022-05-19 DIAGNOSIS — M79642 Pain in left hand: Secondary | ICD-10-CM | POA: Diagnosis not present

## 2022-05-19 DIAGNOSIS — M79641 Pain in right hand: Secondary | ICD-10-CM | POA: Diagnosis not present

## 2022-05-19 DIAGNOSIS — M19041 Primary osteoarthritis, right hand: Secondary | ICD-10-CM | POA: Diagnosis not present

## 2022-05-19 DIAGNOSIS — M19042 Primary osteoarthritis, left hand: Secondary | ICD-10-CM | POA: Diagnosis not present

## 2022-06-01 DIAGNOSIS — S0101XA Laceration without foreign body of scalp, initial encounter: Secondary | ICD-10-CM | POA: Diagnosis not present

## 2022-06-01 DIAGNOSIS — Z23 Encounter for immunization: Secondary | ICD-10-CM | POA: Diagnosis not present

## 2022-06-09 DIAGNOSIS — I639 Cerebral infarction, unspecified: Secondary | ICD-10-CM | POA: Diagnosis not present

## 2022-06-09 DIAGNOSIS — G629 Polyneuropathy, unspecified: Secondary | ICD-10-CM | POA: Diagnosis not present

## 2022-06-09 DIAGNOSIS — R2681 Unsteadiness on feet: Secondary | ICD-10-CM | POA: Diagnosis not present

## 2022-06-09 DIAGNOSIS — R2689 Other abnormalities of gait and mobility: Secondary | ICD-10-CM | POA: Diagnosis not present

## 2022-06-09 DIAGNOSIS — M6281 Muscle weakness (generalized): Secondary | ICD-10-CM | POA: Diagnosis not present

## 2022-07-27 DIAGNOSIS — Z9889 Other specified postprocedural states: Secondary | ICD-10-CM | POA: Diagnosis not present

## 2022-07-27 DIAGNOSIS — M25641 Stiffness of right hand, not elsewhere classified: Secondary | ICD-10-CM | POA: Diagnosis not present

## 2022-08-16 DIAGNOSIS — M25641 Stiffness of right hand, not elsewhere classified: Secondary | ICD-10-CM | POA: Diagnosis not present

## 2022-08-16 DIAGNOSIS — Z9889 Other specified postprocedural states: Secondary | ICD-10-CM | POA: Diagnosis not present

## 2022-08-23 DIAGNOSIS — M25641 Stiffness of right hand, not elsewhere classified: Secondary | ICD-10-CM | POA: Diagnosis not present

## 2022-08-23 DIAGNOSIS — Z9889 Other specified postprocedural states: Secondary | ICD-10-CM | POA: Diagnosis not present

## 2022-08-30 DIAGNOSIS — Z9889 Other specified postprocedural states: Secondary | ICD-10-CM | POA: Diagnosis not present

## 2022-08-30 DIAGNOSIS — M25641 Stiffness of right hand, not elsewhere classified: Secondary | ICD-10-CM | POA: Diagnosis not present

## 2022-09-13 DIAGNOSIS — Z9889 Other specified postprocedural states: Secondary | ICD-10-CM | POA: Diagnosis not present

## 2022-09-13 DIAGNOSIS — M25641 Stiffness of right hand, not elsewhere classified: Secondary | ICD-10-CM | POA: Diagnosis not present

## 2022-09-20 DIAGNOSIS — G245 Blepharospasm: Secondary | ICD-10-CM | POA: Diagnosis not present

## 2022-09-26 DIAGNOSIS — Z9889 Other specified postprocedural states: Secondary | ICD-10-CM | POA: Diagnosis not present

## 2022-09-26 DIAGNOSIS — M25641 Stiffness of right hand, not elsewhere classified: Secondary | ICD-10-CM | POA: Diagnosis not present

## 2022-10-10 DIAGNOSIS — M25641 Stiffness of right hand, not elsewhere classified: Secondary | ICD-10-CM | POA: Diagnosis not present

## 2022-10-10 DIAGNOSIS — Z9889 Other specified postprocedural states: Secondary | ICD-10-CM | POA: Diagnosis not present

## 2022-10-25 DIAGNOSIS — N182 Chronic kidney disease, stage 2 (mild): Secondary | ICD-10-CM | POA: Diagnosis not present

## 2022-10-25 DIAGNOSIS — R69 Illness, unspecified: Secondary | ICD-10-CM | POA: Diagnosis not present

## 2022-10-25 DIAGNOSIS — D75839 Thrombocytosis, unspecified: Secondary | ICD-10-CM | POA: Diagnosis not present

## 2022-10-25 DIAGNOSIS — Z85828 Personal history of other malignant neoplasm of skin: Secondary | ICD-10-CM | POA: Diagnosis not present

## 2022-10-25 DIAGNOSIS — D75838 Other thrombocytosis: Secondary | ICD-10-CM | POA: Diagnosis not present

## 2022-10-25 DIAGNOSIS — L989 Disorder of the skin and subcutaneous tissue, unspecified: Secondary | ICD-10-CM | POA: Diagnosis not present

## 2022-10-25 DIAGNOSIS — N138 Other obstructive and reflux uropathy: Secondary | ICD-10-CM | POA: Diagnosis not present

## 2022-10-25 DIAGNOSIS — N401 Enlarged prostate with lower urinary tract symptoms: Secondary | ICD-10-CM | POA: Diagnosis not present

## 2022-10-27 DIAGNOSIS — R9089 Other abnormal findings on diagnostic imaging of central nervous system: Secondary | ICD-10-CM | POA: Diagnosis not present

## 2022-10-27 DIAGNOSIS — I639 Cerebral infarction, unspecified: Secondary | ICD-10-CM | POA: Diagnosis not present

## 2022-10-27 DIAGNOSIS — H9190 Unspecified hearing loss, unspecified ear: Secondary | ICD-10-CM | POA: Diagnosis not present

## 2022-10-27 DIAGNOSIS — G629 Polyneuropathy, unspecified: Secondary | ICD-10-CM | POA: Diagnosis not present

## 2022-10-27 DIAGNOSIS — R2689 Other abnormalities of gait and mobility: Secondary | ICD-10-CM | POA: Diagnosis not present

## 2022-11-04 DIAGNOSIS — H903 Sensorineural hearing loss, bilateral: Secondary | ICD-10-CM | POA: Diagnosis not present

## 2022-11-15 DIAGNOSIS — Z85828 Personal history of other malignant neoplasm of skin: Secondary | ICD-10-CM | POA: Diagnosis not present

## 2022-11-15 DIAGNOSIS — D492 Neoplasm of unspecified behavior of bone, soft tissue, and skin: Secondary | ICD-10-CM | POA: Diagnosis not present

## 2022-11-15 DIAGNOSIS — L578 Other skin changes due to chronic exposure to nonionizing radiation: Secondary | ICD-10-CM | POA: Diagnosis not present

## 2022-11-15 DIAGNOSIS — D229 Melanocytic nevi, unspecified: Secondary | ICD-10-CM | POA: Diagnosis not present

## 2022-11-15 DIAGNOSIS — C44319 Basal cell carcinoma of skin of other parts of face: Secondary | ICD-10-CM | POA: Diagnosis not present

## 2022-11-15 DIAGNOSIS — L304 Erythema intertrigo: Secondary | ICD-10-CM | POA: Diagnosis not present

## 2022-11-15 DIAGNOSIS — L821 Other seborrheic keratosis: Secondary | ICD-10-CM | POA: Diagnosis not present

## 2022-11-15 DIAGNOSIS — L570 Actinic keratosis: Secondary | ICD-10-CM | POA: Diagnosis not present

## 2022-11-15 DIAGNOSIS — L814 Other melanin hyperpigmentation: Secondary | ICD-10-CM | POA: Diagnosis not present

## 2022-11-30 DIAGNOSIS — E119 Type 2 diabetes mellitus without complications: Secondary | ICD-10-CM | POA: Diagnosis not present

## 2022-11-30 DIAGNOSIS — D75839 Thrombocytosis, unspecified: Secondary | ICD-10-CM | POA: Diagnosis not present

## 2022-12-01 DIAGNOSIS — C44319 Basal cell carcinoma of skin of other parts of face: Secondary | ICD-10-CM | POA: Diagnosis not present

## 2022-12-01 DIAGNOSIS — L814 Other melanin hyperpigmentation: Secondary | ICD-10-CM | POA: Diagnosis not present

## 2022-12-01 DIAGNOSIS — L578 Other skin changes due to chronic exposure to nonionizing radiation: Secondary | ICD-10-CM | POA: Diagnosis not present

## 2022-12-01 DIAGNOSIS — Z85828 Personal history of other malignant neoplasm of skin: Secondary | ICD-10-CM | POA: Diagnosis not present

## 2022-12-14 DIAGNOSIS — D75839 Thrombocytosis, unspecified: Secondary | ICD-10-CM | POA: Diagnosis not present

## 2022-12-26 DIAGNOSIS — D75839 Thrombocytosis, unspecified: Secondary | ICD-10-CM | POA: Diagnosis not present

## 2022-12-27 DIAGNOSIS — D75839 Thrombocytosis, unspecified: Secondary | ICD-10-CM | POA: Diagnosis not present

## 2022-12-27 DIAGNOSIS — D473 Essential (hemorrhagic) thrombocythemia: Secondary | ICD-10-CM | POA: Diagnosis not present

## 2022-12-30 DIAGNOSIS — M6530 Trigger finger, unspecified finger: Secondary | ICD-10-CM | POA: Diagnosis not present

## 2023-01-02 DIAGNOSIS — M19041 Primary osteoarthritis, right hand: Secondary | ICD-10-CM | POA: Diagnosis not present

## 2023-01-02 DIAGNOSIS — M65332 Trigger finger, left middle finger: Secondary | ICD-10-CM | POA: Diagnosis not present

## 2023-01-02 DIAGNOSIS — M19042 Primary osteoarthritis, left hand: Secondary | ICD-10-CM | POA: Diagnosis not present

## 2023-01-02 DIAGNOSIS — M65321 Trigger finger, right index finger: Secondary | ICD-10-CM | POA: Diagnosis not present

## 2023-01-23 DIAGNOSIS — H9202 Otalgia, left ear: Secondary | ICD-10-CM | POA: Diagnosis not present

## 2023-01-24 DIAGNOSIS — H9202 Otalgia, left ear: Secondary | ICD-10-CM | POA: Diagnosis not present

## 2023-01-31 DIAGNOSIS — D473 Essential (hemorrhagic) thrombocythemia: Secondary | ICD-10-CM | POA: Diagnosis not present

## 2023-03-08 DIAGNOSIS — D473 Essential (hemorrhagic) thrombocythemia: Secondary | ICD-10-CM | POA: Diagnosis not present

## 2023-03-15 DIAGNOSIS — G245 Blepharospasm: Secondary | ICD-10-CM | POA: Diagnosis not present

## 2023-03-23 DIAGNOSIS — G8929 Other chronic pain: Secondary | ICD-10-CM | POA: Diagnosis not present

## 2023-03-23 DIAGNOSIS — M25512 Pain in left shoulder: Secondary | ICD-10-CM | POA: Diagnosis not present

## 2023-03-23 DIAGNOSIS — M6281 Muscle weakness (generalized): Secondary | ICD-10-CM | POA: Diagnosis not present

## 2023-03-27 DIAGNOSIS — M654 Radial styloid tenosynovitis [de Quervain]: Secondary | ICD-10-CM | POA: Diagnosis not present

## 2023-03-27 DIAGNOSIS — M65351 Trigger finger, right little finger: Secondary | ICD-10-CM | POA: Diagnosis not present

## 2023-03-30 DIAGNOSIS — G8929 Other chronic pain: Secondary | ICD-10-CM | POA: Diagnosis not present

## 2023-03-30 DIAGNOSIS — M25512 Pain in left shoulder: Secondary | ICD-10-CM | POA: Diagnosis not present

## 2023-03-30 DIAGNOSIS — M6281 Muscle weakness (generalized): Secondary | ICD-10-CM | POA: Diagnosis not present

## 2023-04-04 DIAGNOSIS — M65351 Trigger finger, right little finger: Secondary | ICD-10-CM | POA: Diagnosis not present

## 2023-04-06 DIAGNOSIS — Z23 Encounter for immunization: Secondary | ICD-10-CM | POA: Diagnosis not present

## 2023-04-18 DIAGNOSIS — I951 Orthostatic hypotension: Secondary | ICD-10-CM | POA: Diagnosis not present

## 2023-04-18 DIAGNOSIS — H532 Diplopia: Secondary | ICD-10-CM | POA: Diagnosis not present

## 2023-04-18 DIAGNOSIS — N138 Other obstructive and reflux uropathy: Secondary | ICD-10-CM | POA: Diagnosis not present

## 2023-04-18 DIAGNOSIS — L299 Pruritus, unspecified: Secondary | ICD-10-CM | POA: Diagnosis not present

## 2023-04-18 DIAGNOSIS — I679 Cerebrovascular disease, unspecified: Secondary | ICD-10-CM | POA: Diagnosis not present

## 2023-04-18 DIAGNOSIS — N401 Enlarged prostate with lower urinary tract symptoms: Secondary | ICD-10-CM | POA: Diagnosis not present

## 2023-04-25 DIAGNOSIS — I6502 Occlusion and stenosis of left vertebral artery: Secondary | ICD-10-CM | POA: Diagnosis not present

## 2023-04-25 DIAGNOSIS — R270 Ataxia, unspecified: Secondary | ICD-10-CM | POA: Diagnosis not present

## 2023-04-25 DIAGNOSIS — R269 Unspecified abnormalities of gait and mobility: Secondary | ICD-10-CM | POA: Diagnosis not present

## 2023-04-25 DIAGNOSIS — E041 Nontoxic single thyroid nodule: Secondary | ICD-10-CM | POA: Diagnosis not present

## 2023-04-25 DIAGNOSIS — G453 Amaurosis fugax: Secondary | ICD-10-CM | POA: Diagnosis not present

## 2023-04-25 DIAGNOSIS — H547 Unspecified visual loss: Secondary | ICD-10-CM | POA: Diagnosis not present

## 2023-04-25 DIAGNOSIS — G459 Transient cerebral ischemic attack, unspecified: Secondary | ICD-10-CM | POA: Diagnosis not present

## 2023-04-25 DIAGNOSIS — H532 Diplopia: Secondary | ICD-10-CM | POA: Diagnosis not present

## 2023-04-25 DIAGNOSIS — I6523 Occlusion and stenosis of bilateral carotid arteries: Secondary | ICD-10-CM | POA: Diagnosis not present

## 2023-04-26 DIAGNOSIS — H547 Unspecified visual loss: Secondary | ICD-10-CM | POA: Diagnosis not present

## 2023-04-26 DIAGNOSIS — G459 Transient cerebral ischemic attack, unspecified: Secondary | ICD-10-CM | POA: Diagnosis not present

## 2023-04-26 DIAGNOSIS — I6502 Occlusion and stenosis of left vertebral artery: Secondary | ICD-10-CM | POA: Diagnosis not present

## 2023-04-26 DIAGNOSIS — E041 Nontoxic single thyroid nodule: Secondary | ICD-10-CM | POA: Diagnosis not present

## 2023-04-27 DIAGNOSIS — G459 Transient cerebral ischemic attack, unspecified: Secondary | ICD-10-CM | POA: Diagnosis not present

## 2023-05-01 DIAGNOSIS — G459 Transient cerebral ischemic attack, unspecified: Secondary | ICD-10-CM | POA: Diagnosis not present

## 2023-05-01 DIAGNOSIS — I081 Rheumatic disorders of both mitral and tricuspid valves: Secondary | ICD-10-CM | POA: Diagnosis not present

## 2023-05-02 DIAGNOSIS — E785 Hyperlipidemia, unspecified: Secondary | ICD-10-CM | POA: Diagnosis not present

## 2023-05-02 DIAGNOSIS — I679 Cerebrovascular disease, unspecified: Secondary | ICD-10-CM | POA: Diagnosis not present

## 2023-05-18 DIAGNOSIS — I639 Cerebral infarction, unspecified: Secondary | ICD-10-CM | POA: Diagnosis not present

## 2023-06-05 DIAGNOSIS — L570 Actinic keratosis: Secondary | ICD-10-CM | POA: Diagnosis not present

## 2023-06-05 DIAGNOSIS — L308 Other specified dermatitis: Secondary | ICD-10-CM | POA: Diagnosis not present

## 2023-06-05 DIAGNOSIS — L814 Other melanin hyperpigmentation: Secondary | ICD-10-CM | POA: Diagnosis not present

## 2023-06-05 DIAGNOSIS — Z85828 Personal history of other malignant neoplasm of skin: Secondary | ICD-10-CM | POA: Diagnosis not present

## 2023-06-05 DIAGNOSIS — D229 Melanocytic nevi, unspecified: Secondary | ICD-10-CM | POA: Diagnosis not present

## 2023-06-05 DIAGNOSIS — L821 Other seborrheic keratosis: Secondary | ICD-10-CM | POA: Diagnosis not present

## 2023-06-07 DIAGNOSIS — I639 Cerebral infarction, unspecified: Secondary | ICD-10-CM | POA: Diagnosis not present

## 2023-06-13 DIAGNOSIS — D473 Essential (hemorrhagic) thrombocythemia: Secondary | ICD-10-CM | POA: Diagnosis not present

## 2023-06-16 DIAGNOSIS — I6930 Unspecified sequelae of cerebral infarction: Secondary | ICD-10-CM | POA: Diagnosis not present

## 2023-06-16 DIAGNOSIS — R2689 Other abnormalities of gait and mobility: Secondary | ICD-10-CM | POA: Diagnosis not present

## 2023-06-16 DIAGNOSIS — H53121 Transient visual loss, right eye: Secondary | ICD-10-CM | POA: Diagnosis not present

## 2023-06-16 DIAGNOSIS — N1831 Chronic kidney disease, stage 3a: Secondary | ICD-10-CM | POA: Diagnosis not present

## 2023-06-16 DIAGNOSIS — I471 Supraventricular tachycardia, unspecified: Secondary | ICD-10-CM | POA: Diagnosis not present

## 2023-06-16 DIAGNOSIS — I491 Atrial premature depolarization: Secondary | ICD-10-CM | POA: Diagnosis not present

## 2023-06-16 DIAGNOSIS — G450 Vertebro-basilar artery syndrome: Secondary | ICD-10-CM | POA: Diagnosis not present

## 2023-06-16 DIAGNOSIS — G629 Polyneuropathy, unspecified: Secondary | ICD-10-CM | POA: Diagnosis not present

## 2023-06-20 DIAGNOSIS — I679 Cerebrovascular disease, unspecified: Secondary | ICD-10-CM | POA: Diagnosis not present

## 2023-06-20 DIAGNOSIS — E041 Nontoxic single thyroid nodule: Secondary | ICD-10-CM | POA: Diagnosis not present

## 2023-06-20 DIAGNOSIS — D473 Essential (hemorrhagic) thrombocythemia: Secondary | ICD-10-CM | POA: Diagnosis not present

## 2023-06-22 DIAGNOSIS — N1831 Chronic kidney disease, stage 3a: Secondary | ICD-10-CM | POA: Diagnosis not present

## 2023-06-23 DIAGNOSIS — D473 Essential (hemorrhagic) thrombocythemia: Secondary | ICD-10-CM | POA: Diagnosis not present

## 2023-07-04 DIAGNOSIS — E041 Nontoxic single thyroid nodule: Secondary | ICD-10-CM | POA: Diagnosis not present

## 2023-07-04 DIAGNOSIS — E042 Nontoxic multinodular goiter: Secondary | ICD-10-CM | POA: Diagnosis not present
